# Patient Record
Sex: Female | Born: 1940 | Race: White | Hispanic: No | Smoking: Former smoker
Health system: Southern US, Community
[De-identification: ages and names within clinical notes are randomized; demographics above are authoritative.]

## PROBLEM LIST (undated history)

## (undated) DIAGNOSIS — M1990 Unspecified osteoarthritis, unspecified site: Secondary | ICD-10-CM

---

## 2013-01-12 ENCOUNTER — Other Ambulatory Visit (HOSPITAL_COMMUNITY)
Admission: RE | Admit: 2013-01-12 | Discharge: 2013-01-12 | Disposition: A | Discharge status: Home / Self Care | Payer: Medicare Other | Source: Ambulatory Visit | Attending: Otolaryngology | Admitting: Otolaryngology

## 2013-01-12 DIAGNOSIS — D490 Neoplasm of unspecified behavior of digestive system: Secondary | ICD-10-CM | POA: Insufficient documentation

## 2013-01-19 ENCOUNTER — Other Ambulatory Visit: Payer: Self-pay | Admitting: Otolaryngology

## 2013-01-19 DIAGNOSIS — D490 Neoplasm of unspecified behavior of digestive system: Secondary | ICD-10-CM

## 2013-01-21 ENCOUNTER — Ambulatory Visit
Admission: RE | Admit: 2013-01-21 | Discharge: 2013-01-21 | Disposition: A | Discharge status: Home / Self Care | Payer: Medicare Other | Source: Ambulatory Visit | Attending: Otolaryngology | Admitting: Otolaryngology

## 2013-01-21 DIAGNOSIS — D490 Neoplasm of unspecified behavior of digestive system: Secondary | ICD-10-CM

## 2013-01-21 MED ORDER — IOHEXOL 300 MG/ML  SOLN: 75.0000 mL | Freq: Once | INTRAMUSCULAR | Status: AC | PRN

## 2013-01-25 ENCOUNTER — Other Ambulatory Visit (HOSPITAL_COMMUNITY)
Admission: RE | Admit: 2013-01-25 | Discharge: 2013-01-25 | Disposition: A | Discharge status: Home / Self Care | Payer: Medicare Other | Source: Ambulatory Visit | Attending: Otolaryngology | Admitting: Otolaryngology

## 2013-01-25 ENCOUNTER — Other Ambulatory Visit: Payer: Self-pay | Admitting: Otolaryngology

## 2013-01-25 DIAGNOSIS — R220 Localized swelling, mass and lump, head: Secondary | ICD-10-CM | POA: Insufficient documentation

## 2019-09-18 ENCOUNTER — Ambulatory Visit (HOSPITAL_COMMUNITY)
Admission: EM | Admit: 2019-09-18 | Discharge: 2019-09-18 | Disposition: A | Discharge status: Home / Self Care | Payer: Medicare PPO

## 2019-09-18 ENCOUNTER — Encounter (HOSPITAL_COMMUNITY): Payer: Self-pay

## 2019-09-18 ENCOUNTER — Other Ambulatory Visit: Payer: Self-pay

## 2019-09-18 DIAGNOSIS — M7989 Other specified soft tissue disorders: Secondary | ICD-10-CM

## 2019-09-18 DIAGNOSIS — M79662 Pain in left lower leg: Secondary | ICD-10-CM | POA: Diagnosis not present

## 2019-09-18 MED ORDER — IBUPROFEN 800 MG PO TABS: 800.0000 mg | ORAL_TABLET | Freq: Three times a day (TID) | ORAL | 0 refills | Status: DC
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
## 2019-09-18 NOTE — Discharge Instructions (Addendum)
Take ibuprofen 3 times a day. Compression stockings daily. VERY IMPORTANT to go to ER for worsening pain, swelling, discoloration, or you develop difficulty breathing, chest pain, or palpitations.

## 2020-07-20 ENCOUNTER — Other Ambulatory Visit: Payer: Self-pay

## 2020-07-20 ENCOUNTER — Emergency Department (HOSPITAL_COMMUNITY): Payer: Medicare PPO

## 2020-07-20 ENCOUNTER — Encounter (HOSPITAL_COMMUNITY): Payer: Self-pay

## 2020-07-20 ENCOUNTER — Emergency Department (HOSPITAL_COMMUNITY)
Admission: EM | Admit: 2020-07-20 | Discharge: 2020-07-20 | Disposition: A | Discharge status: Home / Self Care | Payer: Medicare PPO | Attending: Emergency Medicine | Admitting: Emergency Medicine

## 2020-07-20 DIAGNOSIS — Y92019 Unspecified place in single-family (private) house as the place of occurrence of the external cause: Secondary | ICD-10-CM | POA: Insufficient documentation

## 2020-07-20 DIAGNOSIS — M25551 Pain in right hip: Secondary | ICD-10-CM | POA: Diagnosis not present

## 2020-07-20 DIAGNOSIS — R55 Syncope and collapse: Secondary | ICD-10-CM | POA: Insufficient documentation

## 2020-07-20 DIAGNOSIS — S22009A Unspecified fracture of unspecified thoracic vertebra, initial encounter for closed fracture: Secondary | ICD-10-CM | POA: Diagnosis not present

## 2020-07-20 DIAGNOSIS — W228XXA Striking against or struck by other objects, initial encounter: Secondary | ICD-10-CM | POA: Insufficient documentation

## 2020-07-20 DIAGNOSIS — Z87891 Personal history of nicotine dependence: Secondary | ICD-10-CM | POA: Diagnosis not present

## 2020-07-20 DIAGNOSIS — S299XXA Unspecified injury of thorax, initial encounter: Secondary | ICD-10-CM | POA: Diagnosis present

## 2020-07-20 DIAGNOSIS — S22000A Wedge compression fracture of unspecified thoracic vertebra, initial encounter for closed fracture: Secondary | ICD-10-CM

## 2020-07-20 DIAGNOSIS — S2242XA Multiple fractures of ribs, left side, initial encounter for closed fracture: Secondary | ICD-10-CM | POA: Diagnosis not present

## 2020-07-20 LAB — URINALYSIS, ROUTINE W REFLEX MICROSCOPIC
Bacteria, UA: NONE SEEN
Bilirubin Urine: NEGATIVE
Glucose, UA: NEGATIVE mg/dL
Ketones, ur: 80 mg/dL — AB
Leukocytes,Ua: NEGATIVE
Nitrite: NEGATIVE
Protein, ur: NEGATIVE mg/dL
Specific Gravity, Urine: 1.03 (ref 1.005–1.030)
pH: 5 (ref 5.0–8.0)

## 2020-07-20 LAB — CBC
HCT: 41.4 % (ref 36.0–46.0)
Hemoglobin: 13.3 g/dL (ref 12.0–15.0)
MCH: 33.3 pg (ref 26.0–34.0)
MCHC: 32.1 g/dL (ref 30.0–36.0)
MCV: 103.8 fL — ABNORMAL HIGH (ref 80.0–100.0)
Platelets: 195 10*3/uL (ref 150–400)
RBC: 3.99 MIL/uL (ref 3.87–5.11)
RDW: 12.8 % (ref 11.5–15.5)
WBC: 7.2 10*3/uL (ref 4.0–10.5)
nRBC: 0 % (ref 0.0–0.2)

## 2020-07-20 LAB — CBG MONITORING, ED: Glucose-Capillary: 93 mg/dL (ref 70–99)

## 2020-07-20 LAB — BASIC METABOLIC PANEL
Anion gap: 8 (ref 5–15)
BUN: 21 mg/dL (ref 8–23)
CO2: 27 mmol/L (ref 22–32)
Calcium: 9.3 mg/dL (ref 8.9–10.3)
Chloride: 106 mmol/L (ref 98–111)
Creatinine, Ser: 0.77 mg/dL (ref 0.44–1.00)
GFR, Estimated: >60 mL/min (ref >60)
Glucose, Bld: 99 mg/dL (ref 70–99)
Potassium: 3.8 mmol/L (ref 3.5–5.1)
Sodium: 141 mmol/L (ref 135–145)

## 2020-07-20 LAB — TROPONIN I (HIGH SENSITIVITY): Troponin I (High Sensitivity): 4 ng/L (ref <18)

## 2020-07-20 MED ORDER — ACETAMINOPHEN 325 MG PO TABS
650.0000 mg | ORAL_TABLET | Freq: Once | ORAL | Status: DC
  Filled 2020-07-20: qty 2

## 2020-07-20 MED ORDER — IBUPROFEN 200 MG PO TABS
600.0000 mg | ORAL_TABLET | Freq: Once | ORAL | Status: AC
  Administered 2020-07-20: 600 mg via ORAL
  Filled 2020-07-20: qty 3

## 2020-07-20 NOTE — ED Notes (Signed)
Pt given incentive spirometer and instructed on it's use. Pt demonstrated correct use of incentive spirometer.
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
## 2020-08-02 ENCOUNTER — Other Ambulatory Visit: Payer: Self-pay

## 2020-08-02 ENCOUNTER — Encounter: Payer: Self-pay | Admitting: Internal Medicine

## 2020-08-02 ENCOUNTER — Non-Acute Institutional Stay: Payer: Medicare PPO | Admitting: Internal Medicine

## 2020-08-02 VITALS — BP 142/90 | HR 77 | Temp 97.9°F | Ht 68.0 in | Wt 118.9 lb

## 2020-08-02 DIAGNOSIS — S2242XS Multiple fractures of ribs, left side, sequela: Secondary | ICD-10-CM | POA: Diagnosis not present

## 2020-08-02 DIAGNOSIS — S22030D Wedge compression fracture of third thoracic vertebra, subsequent encounter for fracture with routine healing: Secondary | ICD-10-CM

## 2020-08-02 DIAGNOSIS — Z23 Encounter for immunization: Secondary | ICD-10-CM

## 2020-08-02 DIAGNOSIS — M25551 Pain in right hip: Secondary | ICD-10-CM | POA: Diagnosis not present

## 2020-08-02 DIAGNOSIS — R2681 Unsteadiness on feet: Secondary | ICD-10-CM

## 2020-08-02 DIAGNOSIS — I10 Essential (primary) hypertension: Secondary | ICD-10-CM

## 2020-08-02 DIAGNOSIS — M1610 Unilateral primary osteoarthritis, unspecified hip: Secondary | ICD-10-CM | POA: Diagnosis not present

## 2020-08-02 DIAGNOSIS — R4189 Other symptoms and signs involving cognitive functions and awareness: Secondary | ICD-10-CM

## 2020-08-02 NOTE — Progress Notes (Signed)
Location:  [place] of Service:  Clinic (12)  Provider:   Code Status:  Goals of Care:  Advanced Directives 07/20/2020  Does Patient Have a Medical Advance Directive? Yes  Type of Paramedic of [place];Living will     Chief Complaint  Patient presents with  . Medical Management of Chronic Issues    Patient here today to establish care.     HPI: Patient is a 80 y.o. female seen today for medical management of chronic diseases.   Patient with Recent move to [facility] Has recently moved here.   She came to establish care.  She has not seen a primary care physician for many years. Does not take any medications. Her son was also with her  Her active issues today Right hip pain Patient has had hip pain for many years.  It is to the point that it is hard for her to walk and her gait is unstable.  She has had x-ray done in ED which showed severe arthritis She feels she is ready to see orthopedics.  She is requesting to see Dr.Olin Two mechanical falls Last one happened few weeks ago.  She says that she does not remember how exactly she fell.  She did consume alcohol before that and is worried that while she does not remember Went to ED next day when the facility nurse convinced her.  She was found to have left-sided rib fractures.  Old thoracic fractures.  CT scan of head did not show any acute findings Cognitive impairment Noticed by her son and daughter.  She just lost her husband last year to prostate cancer. Has stopped driving now.  Son is taking care of her finances  History reviewed. No pertinent past medical history.  History reviewed. No pertinent surgical history.  No Known Allergies  Outpatient Encounter Medications as of 08/02/2020  Medication Sig  . acetaminophen (TYLENOL) 325 MG tablet Take 650 mg by mouth every 6 (six) hours as needed.  Marland Kitchen ibuprofen (ADVIL) 800 MG tablet Take 1 tablet (800 mg total) by mouth 3 (three)  times daily.  . Multiple Vitamins-Minerals (MULTIVITAMIN WITH MINERALS) tablet Take 1 tablet by mouth daily.  . [DISCONTINUED] cyanocobalamin 100 MCG tablet Take 100 mcg by mouth. Twice a week   No facility-administered encounter medications on file as of 08/02/2020.    Review of Systems:  Review of Systems  Review of Systems  Constitutional: Negative for , appetite change, chills, diaphoresis, positive for fatigue HENT: Negative for mouth sores, postnasal drip, rhinorrhea, sinus pain and sore throat.   Respiratory: Negative for apnea, cough, chest tightness, shortness of breath and wheezing.   Cardiovascular: Negative for chest pain, palpitations and leg swelling.  Gastrointestinal: Negative for abdominal distention, abdominal pain, constipation, diarrhea, nausea and vomiting.  Genitourinary: Negative for dysuria and frequency.  Musculoskeletal: Negative for arthralgias, joint swelling and myalgias.  Skin: Negative for rash.  Neurological: Negative for dizziness, syncope, weakness, light-headedness and numbness.  Psychiatric/Behavioral: Negative for behavioral problems, confusion and sleep disturbance.     Health Maintenance  Topic Date Due  . Zoster Vaccines- Shingrix (1 of 2) Never done  . DEXA SCAN  Never done  . PNA vac Low Risk Adult (1 of 2 - PCV13) Never done  . TETANUS/TDAP  03/04/2017  . INFLUENZA VACCINE  10/02/2020  . COVID-19 Vaccine  Completed  . HPV VACCINES  Aged Out    Physical Exam: Vitals:   08/02/20 1619  BP: (!) 142/90  Pulse: 77  Temp: 97.9 F (36.6 C)  SpO2: 99%  Weight: 118 lb 14.4 oz (53.9 kg)  Height: 5\' 8"  (1.727 m)   Body mass index is 18.08 kg/m. Physical Exam Constitutional: Oriented to person, place, and time. Well-developed and well-nourished.  HENT:  Head: Normocephalic.  Mouth/Throat: Oropharynx is clear and moist.  Eyes: Pupils are equal, round, and reactive to light.  Neck: Neck supple.  Ears TM normal Cardiovascular: Normal  rate and normal heart sounds.  No murmur heard. Pulmonary/Chest: Effort normal and breath sounds normal. No respiratory distress. No wheezes. She has no rales.  Abdominal: Soft. Bowel sounds are normal. No distension. There is no tenderness. There is no rebound.  Musculoskeletal: ChronicVanous changes . Right hip Stiff  Lymphadenopathy: none Neurological: Alert and oriented to person, place, and time.  Has Limping gait Cannot put much weight on Right Hip  Skin: Skin is warm and dry.  Psychiatric: Normal mood and affect. Behavior is normal. Thought content normal.   Labs reviewed: Basic Metabolic Panel: Recent Labs    07/20/20 1008  NA 141  K 3.8  CL 106  CO2 27  GLUCOSE 99  BUN 21  CREATININE 0.77  CALCIUM 9.3   Liver Function Tests: No results for input(s): AST, ALT, ALKPHOS, BILITOT, PROT, ALBUMIN in the last 8760 hours. No results for input(s): LIPASE, AMYLASE in the last 8760 hours. No results for input(s): AMMONIA in the last 8760 hours. CBC: Recent Labs    07/20/20 1008  WBC 7.2  HGB 13.3  HCT 41.4  MCV 103.8*  PLT 195   Lipid Panel: No results for input(s): CHOL, HDL, LDLCALC, TRIG, CHOLHDL, LDLDIRECT in the last 8760 hours. No results found for: HGBA1C  Procedures since last visit: CT Head Wo Contrast  Result Date: 07/20/2020 CLINICAL DATA:  80 year old female status post unwitnessed fall 2 days ago with left chest wall pain and ecchymosis. Right hip pain. EXAM: CT HEAD WITHOUT CONTRAST TECHNIQUE: Contiguous axial images were obtained from the base of the skull through the vertex without intravenous contrast. COMPARISON:  None. FINDINGS: Brain: Cerebral volume is within normal limits for age. No midline shift, ventriculomegaly, mass effect, evidence of mass lesion, intracranial hemorrhage or evidence of cortically based acute infarction. Gray-white matter differentiation is within normal limits throughout the brain. Vascular: No suspicious intracranial vascular  hyperdensity. Skull: Intact, negative. Sinuses/Orbits: Visualized paranasal sinuses and mastoids are clear. Other: No acute orbit or scalp soft tissue finding. IMPRESSION: No acute traumatic injury identified. Normal for age non contrast CT appearance of the brain. Electronically Signed   By: Genevie Ann M.D.   On: 07/20/2020 11:07   CT Chest Wo Contrast  Result Date: 07/20/2020 CLINICAL DATA:  80 year old female status post unwitnessed fall 2 days ago with left chest wall pain and ecchymosis. Right hip pain. EXAM: CT CHEST WITHOUT CONTRAST TECHNIQUE: Multidetector CT imaging of the chest was performed following the standard protocol without IV contrast. COMPARISON:  Neck CT 01/21/2013. FINDINGS: Cardiovascular: Calcified aortic atherosclerosis. Calcified coronary artery atherosclerosis. Upper limits of normal cardiac size. No pericardial effusion. Vascular patency is not evaluated in the absence of IV contrast. Mediastinum/Nodes: No mediastinal hematoma or lymphadenopathy identified in the absence of IV contrast. Lungs/Pleura: Major airways are patent. No pneumothorax. No pulmonary contusion. But there is a trace layering left pleural effusion. Minimal atelectasis or scarring also at the left lung base. No pulmonary nodule. Upper Abdomen: Negative visible noncontrast liver, spleen, pancreas, adrenal glands, kidneys  and bowel in the upper abdomen. Cholelithiasis, with 15 mm gallstones. 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 But mild T2, T3, and T5 superior endplate compression fractures are new since 2014 and age indeterminate (series 7, image 60). Underlying osteopenia. Nondisplaced fracture of the left T9 transverse process on series 5, image 86. Other thoracic vertebrae appear intact. Visible shoulder osseous structures, sternum, and right side ribs appear intact. There are subtle acute fractures of the left 7th,  8th, 9th, and 10th costovertebral junctions (e.g. Series 5, image 69). No other left rib fracture is evident. Visible upper lumbar vertebrae appear intact. IMPRESSION: 1. Osteopenia with acute fractures of left costovertebral junction 7 through 10, associated nondisplaced left T9 transverse process fracture. 2. Mild T2, T3, and T5 superior endplate compression fractures are new since 2014 and age indeterminate but suspicious for acute injury in this setting. 3. Trace left pleural effusion. No pneumothorax or pulmonary contusion. No other acute traumatic injury identified. In the noncontrast chest. 4. Calcified coronary artery and Aortic Atherosclerosis (ICD10-I70.0). Cholelithiasis. Electronically Signed   By: Genevie Ann M.D.   On: 07/20/2020 11:20   DG Hip Unilat W or Wo Pelvis 2-3 Views Right  Result Date: 07/20/2020 CLINICAL DATA:  80 year old female status post unwitnessed fall 2 days ago with left chest wall pain and ecchymosis. Right hip pain. EXAM: DG HIP (WITH OR WITHOUT PELVIS) 2-3V RIGHT COMPARISON:  None. FINDINGS: Severe right hip joint degeneration with complete loss of the superior joint space, subchondral sclerosis, bulky acetabular and femoral head osteophytosis. Other degenerative remodeling of the right femoral head. But the proximal right femur appears to remain intact. No acute pelvis fracture identified. Grossly intact proximal left femur, with normal for age left hip. SI joints appear normal. Negative visible bowel gas. IMPRESSION: Severe unilateral right hip osteoarthritis with no acute fracture or dislocation identified. Electronically Signed   By: Genevie Ann M.D.   On: 07/20/2020 11:10    Assessment/Plan 1. Right hip pain Referal to Ortho  2. Hip arthritis Referal To Ortho Discontinue Aleve unless she really needs it Take only Tylenol PRN  3. Primary hypertension Mildily Elevated BP will follow   4. Closed fracture of multiple ribs of left side, sequela Pain Seems Controlled   Discontinue Aleve and take Tylenol PRn  5. Compression fracture of T2- vertebra with routine healing, subsequent encounter Would Hold Neurosurgery referal right now as she is not having any symptoms or Pain   6. Cognitive impairment MMSE next visit Seems very highly functional  7. Unstable gait   8. Need for vaccination TDAP and Prevnar will start with that first Would Need DEXA Scan next visit    Labs/tests ordered:  * No order type specified * Next appt:  08/03/2020

## 2020-08-03 ENCOUNTER — Ambulatory Visit (INDEPENDENT_AMBULATORY_CARE_PROVIDER_SITE_OTHER): Payer: Medicare PPO | Admitting: *Deleted

## 2020-08-03 ENCOUNTER — Other Ambulatory Visit: Payer: Self-pay

## 2020-08-03 DIAGNOSIS — Z23 Encounter for immunization: Secondary | ICD-10-CM

## 2020-09-28 NOTE — Patient Instructions (Addendum)
DUE TO COVID-19 ONLY ONE VISITOR IS ALLOWED TO COME WITH YOU AND STAY IN THE WAITING ROOM ONLY DURING PRE OP AND PROCEDURE.   **NO VISITORS ARE ALLOWED IN THE SHORT STAY AREA OR RECOVERY ROOM!!**  IF YOU WILL BE ADMITTED INTO THE HOSPITAL YOU ARE ALLOWED ONLY TWO SUPPORT PEOPLE DURING VISITATION HOURS ONLY (10AM -8PM)   The support person(s) may change daily. The support person(s) must pass our screening, gel in and out, and wear a mask at all times, including in the patient's room. Patients must also wear a mask when staff or their support person are in the room.  No visitors under the age of 30. Any visitor under the age of 60 must be accompanied by an adult.    COVID SWAB TESTING MUST BE COMPLETED ON:  09/29/20 @ 11:00   [address] [place], [place] [address] You are not required to quarantine, however you are required to wear a well-fitted mask when you are out and around people not in your household.  Hand Hygiene often Do NOT share personal items Notify your provider if you are in close contact with someone who has COVID or you develop fever 100.4 or greater, new onset of sneezing, cough, sore throat, shortness of breath or body aches.        Your procedure is scheduled on:    Report to [facility] Main  Entrance    Report to admitting at 11:50AM   Call this number if you have problems the morning of surgery [phone]   Do not eat food :After Midnight.   May have liquids until  11:20 AM  day of surgery  CLEAR LIQUID DIET  Foods Allowed                                                                     Foods Excluded  Water, Black Coffee and tea, regular and decaf               liquids that you cannot  Plain Jell-O in any flavor  (No red)                                     see through such as: Fruit ices (not with fruit pulp)                                            milk, soups, orange juice              Iced Popsicles (No red)                                                All solid food                                   Apple juices Sports drinks like Gatorade (No  red) Lightly seasoned clear broth or consume(fat free) Sugar, honey syrup    The day of surgery:  Drink ONE (1) Pre-Surgery Clear Ensure by 11:20 am the morning of surgery. Drink in one sitting. Do not sip.  This drink was given to you during your hospital  pre-op appointment visit. Nothing else to drink after completing the  Pre-Surgery Clear Ensure.          If you have questions, please contact your surgeon's office.     Oral Hygiene is also important to reduce your risk of infection.                                    Remember - BRUSH YOUR TEETH THE MORNING OF SURGERY WITH YOUR REGULAR TOOTHPASTE    Take these medicines the morning of surgery with A SIP OF WATER: Acetaminophen                              You may not have any metal on your body including hair pins, jewelry, and body piercing             Do not wear make-up, lotions, powders, perfumes, or deodorant   Do not bring valuables to the hospital. [place].   Contacts, dentures or bridgework may not be worn into surgery.   Bring small overnight bag day of surgery.   Please read over the following fact sheets you were given: IF YOU HAVE QUESTIONS ABOUT YOUR PRE OP INSTRUCTIONS PLEASE CALL [phone]- [facility] - Preparing for Surgery Before surgery, you can play an important role.  Because skin is not sterile, your skin needs to be as free of germs as possible.  You can reduce the number of germs on your skin by washing with CHG (chlorahexidine gluconate) soap before surgery.  CHG is an antiseptic cleaner which kills germs and bonds with the skin to continue killing germs even after washing. Please DO NOT use if you have an allergy to CHG or antibacterial soaps.  If your skin becomes reddened/irritated stop using the CHG and inform your nurse when  you arrive at Short Stay. Do not shave (including legs and underarms) for at least 48 hours prior to the first CHG shower.  You may shave your face/neck.  Please follow these instructions carefully:  1.  Shower with CHG Soap the night before surgery and the  morning of surgery.  2.  If you choose to wash your hair, wash your hair first as usual with your normal  shampoo.  3.  After you shampoo, rinse your hair and body thoroughly to remove the shampoo.                             4.  Use CHG as you would any other liquid soap.  You can apply chg directly to the skin and wash.  Gently with a scrungie or clean washcloth.  5.  Apply the CHG Soap to your body ONLY FROM THE NECK DOWN.   Do   not use on face/ open  Wound or open sores. Avoid contact with eyes, ears mouth and   genitals (private parts).                       Wash face,  Genitals (private parts) with your normal soap.             6.  Wash thoroughly, paying special attention to the area where your    surgery  will be performed.  7.  Thoroughly rinse your body with warm water from the neck down.  8.  DO NOT shower/wash with your normal soap after using and rinsing off the CHG Soap.                9.  Pat yourself dry with a clean towel.            10.  Wear clean pajamas.            11.  Place clean sheets on your bed the night of your first shower and do not  sleep with pets. Day of Surgery : Do not apply any lotions/deodorants the morning of surgery.  Please wear clean clothes to the hospital/surgery center.  FAILURE TO FOLLOW THESE INSTRUCTIONS MAY RESULT IN THE CANCELLATION OF YOUR SURGERY  PATIENT SIGNATURE_________________________________  NURSE SIGNATURE__________________________________  ________________________________________________________________________   Adam Phenix  An incentive spirometer is a tool that can help keep your lungs clear and active. This tool measures how well you  are filling your lungs with each breath. Taking long deep breaths may help reverse or decrease the chance of developing breathing (pulmonary) problems (especially infection) following: A long period of time when you are unable to move or be active. BEFORE THE PROCEDURE  If the spirometer includes an indicator to show your best effort, your nurse or respiratory therapist will set it to a desired goal. If possible, sit up straight or lean slightly forward. Try not to slouch. Hold the incentive spirometer in an upright position. INSTRUCTIONS FOR USE  Sit on the edge of your bed if possible, or sit up as far as you can in bed or on a chair. Hold the incentive spirometer in an upright position. Breathe out normally. Place the mouthpiece in your mouth and seal your lips tightly around it. Breathe in slowly and as deeply as possible, raising the piston or the ball toward the top of the column. Hold your breath for 3-5 seconds or for as long as possible. Allow the piston or ball to fall to the bottom of the column. Remove the mouthpiece from your mouth and breathe out normally. Rest for a few seconds and repeat Steps 1 through 7 at least 10 times every 1-2 hours when you are awake. Take your time and take a few normal breaths between deep breaths. The spirometer may include an indicator to show your best effort. Use the indicator as a goal to work toward during each repetition. After each set of 10 deep breaths, practice coughing to be sure your lungs are clear. If you have an incision (the cut made at the time of surgery), support your incision when coughing by placing a pillow or rolled up towels firmly against it. Once you are able to get out of bed, walk around indoors and cough well. You may stop using the incentive spirometer when instructed by your caregiver.  RISKS AND COMPLICATIONS Take your time so you do not get dizzy or light-headed. If you are in pain, you may  need to take or ask for pain  medication before doing incentive spirometry. It is harder to take a deep breath if you are having pain. AFTER USE Rest and breathe slowly and easily. It can be helpful to keep track of a log of your progress. Your caregiver can provide you with a simple table to help with this. If you are using the spirometer at home, follow these instructions: [place] IF:  You are having difficultly using the spirometer. You have trouble using the spirometer as often as instructed. Your pain medication is not giving enough relief while using the spirometer. You develop fever of 100.5 F (38.1 C) or higher. SEEK IMMEDIATE MEDICAL CARE IF:  You cough up bloody sputum that had not been present before. You develop fever of 102 F (38.9 C) or greater. You develop worsening pain at or near the incision site. MAKE SURE YOU:  Understand these instructions. Will watch your condition. Will get help right away if you are not doing well or get worse. Document Released: 07/01/2006 Document Revised: 05/13/2011 Document Reviewed: 09/01/2006 Exit[employer] Patient Information 2014 [employer], [place].   ________________________________________________________________________  WHAT IS A BLOOD TRANSFUSION? Blood Transfusion Information  A transfusion is the replacement of blood or some of its parts. Blood is made up of multiple cells which provide different functions. Red blood cells carry oxygen and are used for blood loss replacement. White blood cells fight against infection. Platelets control bleeding. Plasma helps clot blood. Other blood products are available for specialized needs, such as hemophilia or other clotting disorders. BEFORE THE TRANSFUSION  Who gives blood for transfusions?  Healthy volunteers who are fully evaluated to make sure their blood is safe. This is blood bank blood. Transfusion therapy is the safest it has ever been in the practice of medicine. Before blood is taken from a donor, a  complete history is taken to make sure that person has no history of diseases nor engages in risky social behavior (examples are intravenous drug use or sexual activity with multiple partners). The donor's travel history is screened to minimize risk of transmitting infections, such as malaria. The donated blood is tested for signs of infectious diseases, such as HIV and hepatitis. The blood is then tested to be sure it is compatible with you in order to minimize the chance of a transfusion reaction. If you or a relative donates blood, this is often done in anticipation of surgery and is not appropriate for emergency situations. It takes many days to process the donated blood. RISKS AND COMPLICATIONS Although transfusion therapy is very safe and saves many lives, the main dangers of transfusion include:  Getting an infectious disease. Developing a transfusion reaction. This is an allergic reaction to something in the blood you were given. Every precaution is taken to prevent this. The decision to have a blood transfusion has been considered carefully by your caregiver before blood is given. Blood is not given unless the benefits outweigh the risks. AFTER THE TRANSFUSION Right after receiving a blood transfusion, you will usually feel much better and more energetic. This is especially true if your red blood cells have gotten low (anemic). The transfusion raises the level of the red blood cells which carry oxygen, and this usually causes an energy increase. The nurse administering the transfusion will monitor you carefully for complications. HOME CARE INSTRUCTIONS  No special instructions are needed after a transfusion. You may find your energy is better. Speak with your caregiver about any limitations on activity  for underlying diseases you may have. SEEK MEDICAL CARE IF:  Your condition is not improving after your transfusion. You develop redness or irritation at the intravenous (IV) site. SEEK  IMMEDIATE MEDICAL CARE IF:  Any of the following symptoms occur over the next 12 hours: Shaking chills. You have a temperature by mouth above 102 F (38.9 C), not controlled by medicine. Chest, back, or muscle pain. People around you feel you are not acting correctly or are confused. Shortness of breath or difficulty breathing. Dizziness and fainting. You get a rash or develop hives. You have a decrease in urine output. Your urine turns a dark color or changes to pink, red, or brown. Any of the following symptoms occur over the next 10 days: You have a temperature by mouth above 102 F (38.9 C), not controlled by medicine. Shortness of breath. Weakness after normal activity. The white part of the eye turns yellow (jaundice). You have a decrease in the amount of urine or are urinating less often. Your urine turns a dark color or changes to pink, red, or brown. Document Released: 02/16/2000 Document Revised: 05/13/2011 Document Reviewed: 10/05/2007 [facility] Patient Information 2014 [place], [place].  _______________________________________________________________________

## 2020-09-28 NOTE — H&P (Signed)
TOTAL HIP ADMISSION H&P  Patient is admitted for right total hip arthroplasty.  Subjective:  Chief Complaint: right hip pain  HPI: Wendy Blankenship, 80 y.o. female, has a history of pain and functional disability in the right hip(s) due to arthritis and patient has failed non-surgical conservative treatments for greater than 12 weeks to include NSAID's and/or analgesics and activity modification.  Onset of symptoms was gradual starting 2 years ago with gradually worsening course since that time.The patient noted no past surgery on the right hip(s).  Patient currently rates pain in the right hip at 8 out of 10 with activity. Patient has worsening of pain with activity and weight bearing, pain that interfers with activities of daily living, and pain with passive range of motion. Patient has evidence of joint space narrowing by imaging studies. This condition presents safety issues increasing the risk of falls. There is no current active infection.  There are no problems to display for this patient.  No past medical history on file.  No past surgical history on file.  No current facility-administered medications for this encounter.   Current Outpatient Medications  Medication Sig Dispense Refill Last Dose   acetaminophen (TYLENOL) 500 MG tablet Take 1,000 mg by mouth every 8 (eight) hours as needed for mild pain.      Multiple Vitamins-Minerals (MULTIVITAMIN WITH MINERALS) tablet Take 1 tablet by mouth daily.      No Known Allergies  Social History   Tobacco Use   Smoking status: Former   Smokeless tobacco: Never  Substance Use Topics   Alcohol use: Yes    Family History  Problem Relation Age of Onset   Healthy Mother    Parkinson's disease Mother    Healthy Father    Cancer Father      Review of Systems  Constitutional:  Negative for chills and fever.  Respiratory:  Negative for cough and shortness of breath.   Cardiovascular:  Negative for chest pain.  Gastrointestinal:   Negative for nausea and vomiting.  Musculoskeletal:  Positive for arthralgias.   Objective:  Physical Exam Well nourished and well developed. General: Alert and oriented x3, cooperative and pleasant, no acute distress. Head: normocephalic, atraumatic, neck supple. Eyes: EOMI.  Musculoskeletal: Pain with passive and active ROM of the right hip.  Calves soft and nontender. Motor function intact in LE. Strength 5/5 LE bilaterally. Neuro: Distal pulses 2+. Sensation to light touch intact in LE  Vital signs in last 24 hours:   Labs:   Estimated body mass index is 18.08 kg/m as calculated from the following:   Height as of 08/02/20: '5\' 8"'$  (1.727 m).   Weight as of 08/02/20: 53.9 kg.   Imaging Review Plain radiographs demonstrate severe degenerative joint disease of the right hip(s). The bone quality appears to be adequate for age and reported activity level.   Assessment/Plan:  End stage arthritis, right hip(s)  The patient history, physical examination, clinical judgement of the provider and imaging studies are consistent with end stage degenerative joint disease of the right hip(s) and total hip arthroplasty is deemed medically necessary. The treatment options including medical management, injection therapy, arthroscopy and arthroplasty were discussed at length. The risks and benefits of total hip arthroplasty were presented and reviewed. The risks due to aseptic loosening, infection, stiffness, dislocation/subluxation,  thromboembolic complications and other imponderables were discussed.  The patient acknowledged the explanation, agreed to proceed with the plan and consent was signed. Patient is being admitted for inpatient treatment for  surgery, pain control, PT, OT, prophylactic antibiotics, VTE prophylaxis, progressive ambulation and ADL's and discharge planning.The patient is planning to be discharged  home.   Therapy Plans: HEP Disposition: Home with daughter (lives at [facility]) Planned DVT Prophylaxis: aspirin '81mg'$  BID DME needed: walker PCP: Dr. Veleta Miners TXA: IV Allergies: NKDA Anesthesia Concerns: none BMI: 17.9 Last HgbA1c: Not diabetic.  Other: - tylenol, celebrex, robaxin, tramadol   Griffith Citron, PA-C Orthopedic Surgery [facility] [phone]

## 2020-09-28 NOTE — Progress Notes (Addendum)
COVID Vaccine Completed: yes x4 Date COVID Vaccine completed: 04/17/19, 05/15/19 Has received booster: 01/03/20, 08/03/20 COVID vaccine manufacturer:  Moderna   Date of COVID positive in last 90 days: No  PCP - Veleta Miners, MD Cardiologist - N/a  Chest x-ray - CT 07/20/20 Epic EKG - 07/21/20 Epic Stress Test - N/a ECHO - N/a Cardiac Cath - N/a Pacemaker/ICD device last checked:N/a Spinal Cord Stimulator:N/a  Sleep Study - N/a CPAP -   Fasting Blood Sugar - N/a Checks Blood Sugar _____ times a day  Blood Thinner Instructions: N/a Aspirin Instructions: Last Dose:  Activity level: Can go up a flight of stairs and perform activities of daily living without stopping and without symptoms of chest pain or shortness of breath. Only difficulty with stairs is due to hip pain.      Anesthesia review:   Patient denies shortness of breath, fever, cough and chest pain at PAT appointment   Patient verbalized understanding of instructions that were given to them at the PAT appointment. Patient was also instructed that they will need to review over the PAT instructions again at home before surgery.

## 2020-09-29 ENCOUNTER — Encounter (HOSPITAL_COMMUNITY)
Admission: RE | Admit: 2020-09-29 | Discharge: 2020-09-29 | Disposition: A | Discharge status: Home / Self Care | Payer: Medicare PPO | Source: Ambulatory Visit | Attending: Orthopedic Surgery | Admitting: Orthopedic Surgery

## 2020-09-29 ENCOUNTER — Other Ambulatory Visit: Payer: Self-pay

## 2020-09-29 ENCOUNTER — Encounter (HOSPITAL_COMMUNITY): Payer: Self-pay

## 2020-09-29 ENCOUNTER — Other Ambulatory Visit (HOSPITAL_COMMUNITY)
Admission: RE | Admit: 2020-09-29 | Discharge: 2020-09-29 | Disposition: A | Discharge status: Home / Self Care | Payer: Medicare PPO | Source: Ambulatory Visit | Attending: Orthopedic Surgery | Admitting: Orthopedic Surgery

## 2020-09-29 DIAGNOSIS — Z01812 Encounter for preprocedural laboratory examination: Secondary | ICD-10-CM | POA: Insufficient documentation

## 2020-09-29 DIAGNOSIS — Z20822 Contact with and (suspected) exposure to covid-19: Secondary | ICD-10-CM | POA: Insufficient documentation

## 2020-09-29 HISTORY — DX: Unspecified osteoarthritis, unspecified site: M19.90

## 2020-09-29 LAB — SURGICAL PCR SCREEN
MRSA, PCR: NEGATIVE
Staphylococcus aureus: NEGATIVE

## 2020-09-29 LAB — COMPREHENSIVE METABOLIC PANEL
ALT: 17 U/L (ref 0–44)
AST: 21 U/L (ref 15–41)
Albumin: 4.2 g/dL (ref 3.5–5.0)
Alkaline Phosphatase: 49 U/L (ref 38–126)
Anion gap: 7 (ref 5–15)
BUN: 17 mg/dL (ref 8–23)
CO2: 28 mmol/L (ref 22–32)
Calcium: 9.5 mg/dL (ref 8.9–10.3)
Chloride: 105 mmol/L (ref 98–111)
Creatinine, Ser: 0.66 mg/dL (ref 0.44–1.00)
GFR, Estimated: >60 mL/min (ref >60)
Glucose, Bld: 90 mg/dL (ref 70–99)
Potassium: 4.6 mmol/L (ref 3.5–5.1)
Sodium: 140 mmol/L (ref 135–145)
Total Bilirubin: 0.8 mg/dL (ref 0.3–1.2)
Total Protein: 7.1 g/dL (ref 6.5–8.1)

## 2020-09-29 LAB — CBC
HCT: 40.5 % (ref 36.0–46.0)
Hemoglobin: 13.2 g/dL (ref 12.0–15.0)
MCH: 33.1 pg (ref 26.0–34.0)
MCHC: 32.6 g/dL (ref 30.0–36.0)
MCV: 101.5 fL — ABNORMAL HIGH (ref 80.0–100.0)
Platelets: 228 10*3/uL (ref 150–400)
RBC: 3.99 MIL/uL (ref 3.87–5.11)
RDW: 12.1 % (ref 11.5–15.5)
WBC: 4.8 10*3/uL (ref 4.0–10.5)
nRBC: 0 % (ref 0.0–0.2)

## 2020-09-29 LAB — SARS CORONAVIRUS 2 (TAT 6-24 HRS): SARS Coronavirus 2: NEGATIVE

## 2020-10-03 ENCOUNTER — Other Ambulatory Visit: Payer: Self-pay

## 2020-10-03 ENCOUNTER — Observation Stay (HOSPITAL_COMMUNITY): Payer: Medicare PPO

## 2020-10-03 ENCOUNTER — Ambulatory Visit (HOSPITAL_COMMUNITY): Payer: Medicare PPO

## 2020-10-03 ENCOUNTER — Encounter (HOSPITAL_COMMUNITY): Payer: Self-pay | Admitting: Orthopedic Surgery

## 2020-10-03 ENCOUNTER — Ambulatory Visit (HOSPITAL_COMMUNITY): Payer: Medicare PPO | Admitting: Registered Nurse

## 2020-10-03 ENCOUNTER — Encounter (HOSPITAL_COMMUNITY)
Admission: RE | Disposition: A | Discharge status: Home / Self Care | Payer: Self-pay | Source: Home / Self Care | Attending: Orthopedic Surgery

## 2020-10-03 ENCOUNTER — Observation Stay (HOSPITAL_COMMUNITY)
Admission: RE | Admit: 2020-10-03 | Discharge: 2020-10-05 | Disposition: A | Discharge status: Home / Self Care | Payer: Medicare PPO | Attending: Orthopedic Surgery | Admitting: Orthopedic Surgery

## 2020-10-03 DIAGNOSIS — Z20822 Contact with and (suspected) exposure to covid-19: Secondary | ICD-10-CM | POA: Insufficient documentation

## 2020-10-03 DIAGNOSIS — M1611 Unilateral primary osteoarthritis, right hip: Principal | ICD-10-CM | POA: Insufficient documentation

## 2020-10-03 DIAGNOSIS — Z96649 Presence of unspecified artificial hip joint: Secondary | ICD-10-CM

## 2020-10-03 DIAGNOSIS — Z87891 Personal history of nicotine dependence: Secondary | ICD-10-CM | POA: Diagnosis not present

## 2020-10-03 DIAGNOSIS — S72041A Displaced fracture of base of neck of right femur, initial encounter for closed fracture: Secondary | ICD-10-CM

## 2020-10-03 HISTORY — PX: TOTAL HIP ARTHROPLASTY: SHX124

## 2020-10-03 LAB — TYPE AND SCREEN
ABO/RH(D): A POS
Antibody Screen: NEGATIVE

## 2020-10-03 SURGERY — ARTHROPLASTY, HIP, TOTAL, ANTERIOR APPROACH
Anesthesia: Spinal | Site: Hip | Laterality: Right

## 2020-10-03 MED ORDER — ONDANSETRON HCL 4 MG/2ML IJ SOLN: 4.0000 mg | Freq: Four times a day (QID) | INTRAMUSCULAR | Status: DC | PRN

## 2020-10-03 MED ORDER — BISACODYL 10 MG RE SUPP: 10.0000 mg | Freq: Every day | RECTAL | Status: DC | PRN

## 2020-10-03 MED ORDER — STERILE WATER FOR IRRIGATION IR SOLN
Status: DC | PRN
  Administered 2020-10-03: 2000 mL

## 2020-10-03 MED ORDER — TRAMADOL HCL 50 MG PO TABS
50.0000 mg | ORAL_TABLET | Freq: Four times a day (QID) | ORAL | Status: DC | PRN
  Filled 2020-10-03: qty 2

## 2020-10-03 MED ORDER — TRANEXAMIC ACID-NACL 1000-0.7 MG/100ML-% IV SOLN: 1000.0000 mg | Freq: Once | INTRAVENOUS | Status: DC

## 2020-10-03 MED ORDER — ACETAMINOPHEN 10 MG/ML IV SOLN
INTRAVENOUS | Status: AC
  Filled 2020-10-03: qty 100

## 2020-10-03 MED ORDER — DEXAMETHASONE SODIUM PHOSPHATE 10 MG/ML IJ SOLN
10.0000 mg | Freq: Once | INTRAMUSCULAR | Status: AC
  Administered 2020-10-04: 10 mg via INTRAVENOUS
  Filled 2020-10-03: qty 1

## 2020-10-03 MED ORDER — TRANEXAMIC ACID-NACL 1000-0.7 MG/100ML-% IV SOLN
1000.0000 mg | Freq: Once | INTRAVENOUS | Status: AC
  Administered 2020-10-03: 1000 mg via INTRAVENOUS
  Filled 2020-10-03: qty 100

## 2020-10-03 MED ORDER — METOCLOPRAMIDE HCL 5 MG PO TABS
5.0000 mg | ORAL_TABLET | Freq: Three times a day (TID) | ORAL | Status: DC | PRN
Start: 2020-10-03 — End: 2020-10-05

## 2020-10-03 MED ORDER — FENTANYL CITRATE (PF) 100 MCG/2ML IJ SOLN
INTRAMUSCULAR | Status: AC
  Filled 2020-10-03: qty 2

## 2020-10-03 MED ORDER — CEFAZOLIN SODIUM-DEXTROSE 2-4 GM/100ML-% IV SOLN
2.0000 g | INTRAVENOUS | Status: AC
Start: 2020-10-03 — End: 2020-10-03
  Administered 2020-10-03: 2 g via INTRAVENOUS
  Filled 2020-10-03: qty 100

## 2020-10-03 MED ORDER — METHOCARBAMOL 1000 MG/10ML IJ SOLN
500.0000 mg | Freq: Four times a day (QID) | INTRAVENOUS | Status: DC | PRN
  Filled 2020-10-03: qty 5

## 2020-10-03 MED ORDER — 0.9 % SODIUM CHLORIDE (POUR BTL) OPTIME
TOPICAL | Status: DC | PRN
  Administered 2020-10-03: 1000 mL

## 2020-10-03 MED ORDER — PROPOFOL 500 MG/50ML IV EMUL
INTRAVENOUS | Status: DC | PRN
  Administered 2020-10-03: 50 ug/kg/min via INTRAVENOUS

## 2020-10-03 MED ORDER — SODIUM CHLORIDE 0.9 % IV SOLN: INTRAVENOUS | Status: DC

## 2020-10-03 MED ORDER — ALBUMIN HUMAN 5 % IV SOLN: INTRAVENOUS | Status: DC | PRN

## 2020-10-03 MED ORDER — ONDANSETRON HCL 4 MG/2ML IJ SOLN
INTRAMUSCULAR | Status: DC | PRN
  Administered 2020-10-03: 4 mg via INTRAVENOUS

## 2020-10-03 MED ORDER — MENTHOL 3 MG MT LOZG: 1.0000 | LOZENGE | OROMUCOSAL | Status: DC | PRN

## 2020-10-03 MED ORDER — LACTATED RINGERS IV SOLN: INTRAVENOUS | Status: DC

## 2020-10-03 MED ORDER — ACETAMINOPHEN 10 MG/ML IV SOLN
INTRAVENOUS | Status: DC | PRN
  Administered 2020-10-03: 1000 mg via INTRAVENOUS

## 2020-10-03 MED ORDER — MORPHINE SULFATE (PF) 2 MG/ML IV SOLN: 0.5000 mg | INTRAVENOUS | Status: DC | PRN

## 2020-10-03 MED ORDER — ACETAMINOPHEN 325 MG PO TABS
325.0000 mg | ORAL_TABLET | Freq: Four times a day (QID) | ORAL | Status: DC | PRN
  Administered 2020-10-05: 650 mg via ORAL
  Filled 2020-10-03: qty 2

## 2020-10-03 MED ORDER — POLYETHYLENE GLYCOL 3350 17 G PO PACK: 17.0000 g | PACK | Freq: Every day | ORAL | Status: DC | PRN

## 2020-10-03 MED ORDER — EPHEDRINE SULFATE-NACL 50-0.9 MG/10ML-% IV SOSY
PREFILLED_SYRINGE | INTRAVENOUS | Status: DC | PRN
  Administered 2020-10-03: 5 mg via INTRAVENOUS

## 2020-10-03 MED ORDER — FENTANYL CITRATE (PF) 100 MCG/2ML IJ SOLN
INTRAMUSCULAR | Status: AC
  Administered 2020-10-03: 25 ug via INTRAVENOUS
  Filled 2020-10-03: qty 2

## 2020-10-03 MED ORDER — FERROUS SULFATE 325 (65 FE) MG PO TABS
325.0000 mg | ORAL_TABLET | Freq: Three times a day (TID) | ORAL | Status: DC
  Administered 2020-10-04 – 2020-10-05 (×4): 325 mg via ORAL
  Filled 2020-10-03 (×4): qty 1

## 2020-10-03 MED ORDER — METHOCARBAMOL 500 MG PO TABS
500.0000 mg | ORAL_TABLET | Freq: Four times a day (QID) | ORAL | Status: DC | PRN
  Filled 2020-10-03: qty 1

## 2020-10-03 MED ORDER — ONDANSETRON HCL 4 MG PO TABS: 4.0000 mg | ORAL_TABLET | Freq: Four times a day (QID) | ORAL | Status: DC | PRN

## 2020-10-03 MED ORDER — PROPOFOL 500 MG/50ML IV EMUL
INTRAVENOUS | Status: AC
  Filled 2020-10-03: qty 50

## 2020-10-03 MED ORDER — PROPOFOL 10 MG/ML IV BOLUS
INTRAVENOUS | Status: DC | PRN
  Administered 2020-10-03 (×4): 20 mg via INTRAVENOUS

## 2020-10-03 MED ORDER — POVIDONE-IODINE 10 % EX SWAB
2.0000 | Freq: Once | CUTANEOUS | Status: AC
Start: 2020-10-03 — End: 2020-10-03
  Administered 2020-10-03: 2 via TOPICAL

## 2020-10-03 MED ORDER — DIPHENHYDRAMINE HCL 12.5 MG/5ML PO ELIX
12.5000 mg | ORAL_SOLUTION | ORAL | Status: DC | PRN
Start: 2020-10-03 — End: 2020-10-05

## 2020-10-03 MED ORDER — ONDANSETRON HCL 4 MG/2ML IJ SOLN
INTRAMUSCULAR | Status: AC
  Filled 2020-10-03: qty 2

## 2020-10-03 MED ORDER — ORAL CARE MOUTH RINSE: 15.0000 mL | Freq: Once | OROMUCOSAL | Status: AC

## 2020-10-03 MED ORDER — PHENYLEPHRINE HCL (PRESSORS) 10 MG/ML IV SOLN
INTRAVENOUS | Status: AC
  Filled 2020-10-03: qty 2

## 2020-10-03 MED ORDER — ASPIRIN 81 MG PO CHEW
81.0000 mg | CHEWABLE_TABLET | Freq: Two times a day (BID) | ORAL | Status: DC
  Administered 2020-10-03 – 2020-10-05 (×4): 81 mg via ORAL
  Filled 2020-10-03 (×4): qty 1

## 2020-10-03 MED ORDER — DOCUSATE SODIUM 100 MG PO CAPS
100.0000 mg | ORAL_CAPSULE | Freq: Two times a day (BID) | ORAL | Status: DC
  Administered 2020-10-03 – 2020-10-05 (×4): 100 mg via ORAL
  Filled 2020-10-03 (×4): qty 1

## 2020-10-03 MED ORDER — ACETAMINOPHEN 500 MG PO TABS: 1000.0000 mg | ORAL_TABLET | Freq: Once | ORAL | Status: DC

## 2020-10-03 MED ORDER — DEXAMETHASONE SODIUM PHOSPHATE 10 MG/ML IJ SOLN
INTRAMUSCULAR | Status: AC
  Filled 2020-10-03: qty 1

## 2020-10-03 MED ORDER — ALBUMIN HUMAN 5 % IV SOLN
INTRAVENOUS | Status: AC
  Filled 2020-10-03: qty 250

## 2020-10-03 MED ORDER — CELECOXIB 200 MG PO CAPS
200.0000 mg | ORAL_CAPSULE | Freq: Two times a day (BID) | ORAL | Status: DC
  Administered 2020-10-03 – 2020-10-05 (×4): 200 mg via ORAL
  Filled 2020-10-03 (×4): qty 1

## 2020-10-03 MED ORDER — BUPIVACAINE IN DEXTROSE 0.75-8.25 % IT SOLN
INTRATHECAL | Status: DC | PRN
  Administered 2020-10-03: 1.6 mL via INTRATHECAL

## 2020-10-03 MED ORDER — FENTANYL CITRATE (PF) 100 MCG/2ML IJ SOLN
INTRAMUSCULAR | Status: DC | PRN
  Administered 2020-10-03: 50 ug via INTRAVENOUS

## 2020-10-03 MED ORDER — CEFAZOLIN SODIUM-DEXTROSE 2-4 GM/100ML-% IV SOLN
2.0000 g | Freq: Four times a day (QID) | INTRAVENOUS | Status: AC
  Administered 2020-10-03 – 2020-10-04 (×2): 2 g via INTRAVENOUS
  Filled 2020-10-03 (×2): qty 100

## 2020-10-03 MED ORDER — METOCLOPRAMIDE HCL 5 MG/ML IJ SOLN: 5.0000 mg | Freq: Three times a day (TID) | INTRAMUSCULAR | Status: DC | PRN

## 2020-10-03 MED ORDER — TRANEXAMIC ACID-NACL 1000-0.7 MG/100ML-% IV SOLN
1000.0000 mg | INTRAVENOUS | Status: AC
  Administered 2020-10-03: 1000 mg via INTRAVENOUS
  Filled 2020-10-03: qty 100

## 2020-10-03 MED ORDER — PHENYLEPHRINE HCL-NACL 20-0.9 MG/250ML-% IV SOLN
INTRAVENOUS | Status: DC | PRN
  Administered 2020-10-03: 25 ug/min via INTRAVENOUS

## 2020-10-03 MED ORDER — CHLORHEXIDINE GLUCONATE 0.12 % MT SOLN
15.0000 mL | Freq: Once | OROMUCOSAL | Status: AC
  Administered 2020-10-03: 15 mL via OROMUCOSAL

## 2020-10-03 MED ORDER — EPHEDRINE 5 MG/ML INJ
INTRAVENOUS | Status: AC
  Filled 2020-10-03: qty 5

## 2020-10-03 MED ORDER — PHENOL 1.4 % MT LIQD: 1.0000 | OROMUCOSAL | Status: DC | PRN

## 2020-10-03 MED ORDER — HYDROCODONE-ACETAMINOPHEN 5-325 MG PO TABS
1.0000 | ORAL_TABLET | ORAL | Status: DC | PRN
  Administered 2020-10-04 (×3): 1 via ORAL
  Filled 2020-10-03 (×3): qty 1

## 2020-10-03 MED ORDER — PROPOFOL 10 MG/ML IV BOLUS
INTRAVENOUS | Status: AC
  Filled 2020-10-03: qty 20

## 2020-10-03 MED ORDER — DEXAMETHASONE SODIUM PHOSPHATE 10 MG/ML IJ SOLN
8.0000 mg | Freq: Once | INTRAMUSCULAR | Status: AC
  Administered 2020-10-03: 8 mg via INTRAVENOUS

## 2020-10-03 MED ORDER — FENTANYL CITRATE (PF) 100 MCG/2ML IJ SOLN
25.0000 ug | INTRAMUSCULAR | Status: DC | PRN
  Administered 2020-10-03 (×3): 25 ug via INTRAVENOUS

## 2020-10-03 SURGICAL SUPPLY — 44 items
ADH SKN CLS APL DERMABOND .7 (GAUZE/BANDAGES/DRESSINGS) ×1
BAG COUNTER SPONGE SURGICOUNT (BAG) IMPLANT
BAG DECANTER FOR FLEXI CONT (MISCELLANEOUS) IMPLANT
BAG SPEC THK2 15X12 ZIP CLS (MISCELLANEOUS)
BAG SPNG CNTER NS LX DISP (BAG)
BAG ZIPLOCK 12X15 (MISCELLANEOUS) IMPLANT
BLADE SAG 18X100X1.27 (BLADE) ×2 IMPLANT
COVER PERINEAL POST (MISCELLANEOUS) ×2 IMPLANT
COVER SURGICAL LIGHT HANDLE (MISCELLANEOUS) ×2 IMPLANT
CUP ACETBLR 54 OD PINNACLE (Hips) ×1 IMPLANT
DERMABOND ADVANCED (GAUZE/BANDAGES/DRESSINGS) ×1
DERMABOND ADVANCED .7 DNX12 (GAUZE/BANDAGES/DRESSINGS) ×1 IMPLANT
DRAPE FOOT SWITCH (DRAPES) ×2 IMPLANT
DRAPE STERI IOBAN 125X83 (DRAPES) ×2 IMPLANT
DRAPE U-SHAPE 47X51 STRL (DRAPES) ×4 IMPLANT
DRESSING AQUACEL AG SP 3.5X10 (GAUZE/BANDAGES/DRESSINGS) ×1 IMPLANT
DRSG AQUACEL AG ADV 3.5X10 (GAUZE/BANDAGES/DRESSINGS) ×1 IMPLANT
DRSG AQUACEL AG SP 3.5X10 (GAUZE/BANDAGES/DRESSINGS) ×2
DURAPREP 26ML APPLICATOR (WOUND CARE) ×2 IMPLANT
ELECT REM PT RETURN 15FT ADLT (MISCELLANEOUS) ×2 IMPLANT
ELIMINATOR HOLE APEX DEPUY (Hips) ×1 IMPLANT
GLOVE SURG ENC MOIS LTX SZ6 (GLOVE) ×4 IMPLANT
GLOVE SURG UNDER LTX SZ7.5 (GLOVE) ×2 IMPLANT
GLOVE SURG UNDER POLY LF SZ6.5 (GLOVE) ×2 IMPLANT
GLOVE SURG UNDER POLY LF SZ7.5 (GLOVE) ×4 IMPLANT
GOWN STRL REUS W/TWL LRG LVL3 (GOWN DISPOSABLE) ×4 IMPLANT
HEAD M SROM 36MM PLUS 1.5 (Hips) IMPLANT
HOLDER FOLEY CATH W/STRAP (MISCELLANEOUS) ×2 IMPLANT
KIT TURNOVER KIT A (KITS) ×2 IMPLANT
LINER NEUTRAL 54X36MM PLUS 4 (Hips) ×1 IMPLANT
PACK ANTERIOR HIP CUSTOM (KITS) ×2 IMPLANT
PENCIL SMOKE EVACUATOR (MISCELLANEOUS) IMPLANT
SCREW 6.5MMX30MM (Screw) ×1 IMPLANT
SROM M HEAD 36MM PLUS 1.5 (Hips) ×2 IMPLANT
STEM FEMORAL SZ6 HIGH ACTIS (Stem) ×1 IMPLANT
SUT MNCRL AB 4-0 PS2 18 (SUTURE) ×2 IMPLANT
SUT STRATAFIX 0 PDS 27 VIOLET (SUTURE) ×2
SUT VIC AB 1 CT1 36 (SUTURE) ×6 IMPLANT
SUT VIC AB 2-0 CT1 27 (SUTURE) ×4
SUT VIC AB 2-0 CT1 TAPERPNT 27 (SUTURE) ×2 IMPLANT
SUTURE STRATFX 0 PDS 27 VIOLET (SUTURE) ×1 IMPLANT
TRAY FOLEY MTR SLVR 16FR STAT (SET/KITS/TRAYS/PACK) IMPLANT
TUBE SUCTION HIGH CAP CLEAR NV (SUCTIONS) ×2 IMPLANT
WATER STERILE IRR 1000ML POUR (IV SOLUTION) ×2 IMPLANT

## 2020-10-03 NOTE — Transfer of Care (Signed)
Immediate Anesthesia Transfer of Care Note  Patient: Wendy Blankenship  Procedure(s) Performed: TOTAL HIP ARTHROPLASTY ANTERIOR APPROACH (Right: Hip)  Patient Location: PACU  Anesthesia Type:Spinal  Level of Consciousness: awake, alert  and oriented  Airway & Oxygen Therapy: Patient Spontanous Breathing and Patient connected to face mask oxygen  Post-op Assessment: Report given to RN and Post -op Vital signs reviewed and stable  Post vital signs: Reviewed and stable  Last Vitals:  Vitals Value Taken Time  BP    Temp    Pulse    Resp    SpO2      Last Pain:  Vitals:   10/03/20 1231  TempSrc:   PainSc: 0-No pain      Patients Stated Pain Goal: 4 ([id number])  Complications: No notable events documented.

## 2020-10-03 NOTE — Anesthesia Procedure Notes (Signed)
Spinal  Patient location during procedure: OR Start time: 10/03/2020 3:02 PM End time: 10/03/2020 3:08 PM Reason for block: surgical anesthesia Staffing Performed: resident/CRNA  Resident/CRNA: Lollie Sails, CRNA Preanesthetic Checklist Completed: patient identified, IV checked, site marked, risks and benefits discussed, surgical consent, monitors and equipment checked, pre-op evaluation and timeout performed Spinal Block Patient position: sitting Prep: DuraPrep and site prepped and draped Patient monitoring: heart rate, continuous pulse ox, blood pressure and cardiac monitor Approach: midline Location: L3-4 Injection technique: single-shot Needle Needle type: Pencan  Needle gauge: 24 G Needle length: 10 cm Assessment Events: CSF return Additional Notes Expiration date on kit noted and within range.  Sterile prep and drape.   Localized with 1% Lidocaine.  Good CSF flow noted with no heme or c/o paresthesia.   Patient tolerated well.

## 2020-10-03 NOTE — Interval H&P Note (Signed)
History and Physical Interval Note:  10/03/2020 1:35 PM  Wendy Blankenship  has presented today for surgery, with the diagnosis of Right hip osteoarthritis.  The various methods of treatment have been discussed with the patient and family. After consideration of risks, benefits and other options for treatment, the patient has consented to  Procedure(s): TOTAL HIP ARTHROPLASTY ANTERIOR APPROACH (Right) as a surgical intervention.  The patient's history has been reviewed, patient examined, no change in status, stable for surgery.  I have reviewed the patient's chart and labs.  Questions were answered to the patient's satisfaction.     Mauri Pole

## 2020-10-03 NOTE — Discharge Instructions (Signed)

## 2020-10-03 NOTE — Anesthesia Postprocedure Evaluation (Signed)
Anesthesia Post Note  Patient: Wendy Blankenship  Procedure(s) Performed: TOTAL HIP ARTHROPLASTY ANTERIOR APPROACH (Right: Hip)     Patient location during evaluation: PACU Anesthesia Type: Spinal Level of consciousness: oriented and awake and alert Pain management: pain level controlled Vital Signs Assessment: post-procedure vital signs reviewed and stable Respiratory status: spontaneous breathing, respiratory function stable and patient connected to nasal cannula oxygen Cardiovascular status: blood pressure returned to baseline and stable Postop Assessment: no headache, no backache, no apparent nausea or vomiting, spinal receding and patient able to bend at knees Anesthetic complications: no   No notable events documented.  Last Vitals:  Vitals:   10/03/20 1723 10/03/20 1730  BP:  (!) 115/97  Pulse: 60 62  Resp: 20 19  Temp: 36.7 C   SpO2: 100% 100%    Last Pain:  Vitals:   10/03/20 1750  TempSrc:   PainSc: 0-No pain                 Khloee Garza,W. EDMOND

## 2020-10-03 NOTE — Anesthesia Preprocedure Evaluation (Addendum)
Anesthesia Evaluation  Patient identified by MRN, date of birth, ID band Patient awake    Reviewed: Allergy & Precautions, H&P , NPO status , Patient's Chart, lab work & pertinent test results  Airway Mallampati: III  TM Distance: >3 FB Neck ROM: Full    Dental no notable dental hx. (+) Teeth Intact, Dental Advisory Given   Pulmonary neg pulmonary ROS, former smoker,    Pulmonary exam normal breath sounds clear to auscultation       Cardiovascular negative cardio ROS   Rhythm:Regular Rate:Normal     Neuro/Psych negative neurological ROS  negative psych ROS   GI/Hepatic negative GI ROS, Neg liver ROS,   Endo/Other  negative endocrine ROS  Renal/GU negative Renal ROS  negative genitourinary   Musculoskeletal  (+) Arthritis , Osteoarthritis,    Abdominal   Peds  Hematology negative hematology ROS (+)   Anesthesia Other Findings   Reproductive/Obstetrics negative OB ROS                            Anesthesia Physical Anesthesia Plan  ASA: 2  Anesthesia Plan: Spinal   Post-op Pain Management:    Induction: Intravenous  PONV Risk Score and Plan: 3 and Propofol infusion, Ondansetron and Dexamethasone  Airway Management Planned: Simple Face Mask  Additional Equipment:   Intra-op Plan:   Post-operative Plan:   Informed Consent: I have reviewed the patients History and Physical, chart, labs and discussed the procedure including the risks, benefits and alternatives for the proposed anesthesia with the patient or authorized representative who has indicated his/her understanding and acceptance.     Dental advisory given  Plan Discussed with: CRNA  Anesthesia Plan Comments:         Anesthesia Quick Evaluation

## 2020-10-03 NOTE — Op Note (Signed)
NAME:  Wendy Blankenship                ACCOUNT NO.: [id number]      MEDICAL RECORD NO.: [id number]      FACILITY:  [facility]      PHYSICIAN:  Mauri Pole  DATE OF BIRTH:  February 14, 1941     DATE OF PROCEDURE:  10/03/2020                                 OPERATIVE REPORT         PREOPERATIVE DIAGNOSIS: Right  hip osteoarthritis.      POSTOPERATIVE DIAGNOSIS:  Right hip osteoarthritis.      PROCEDURE:  Right total hip replacement through an anterior approach   utilizing DePuy THR system, component size 54 mm pinnacle cup, a size 36+4 neutral   Altrex liner, a size 6 Hi Actis stem with a 36+1.5 Articuleze metal head ball.      SURGEON:  Pietro Cassis. Alvan Dame, M.D.      ASSISTANT:  Costella Hatcher, PA-C     ANESTHESIA:  Spinal.      SPECIMENS:  None.      COMPLICATIONS:  None.      BLOOD LOSS:  525 cc     DRAINS:  None.      INDICATION OF THE PROCEDURE:  Wendy Blankenship is a 80 y.o. female who had   presented to office for evaluation of right hip pain.  Radiographs revealed   progressive degenerative changes with bone-on-bone   articulation of the  hip joint, including subchondral cystic changes and osteophytes.  The patient had painful limited range of   motion significantly affecting their overall quality of life and function.  The patient was failing to    respond to conservative measures including medications and/or injections and activity modification and at this point was ready   to proceed with more definitive measures.  Consent was obtained for   benefit of pain relief.  Specific risks of infection, DVT, component   failure, dislocation, neurovascular injury, and need for revision surgery were reviewed in the office as well discussion of   the anterior versus posterior approach were reviewed.     PROCEDURE IN DETAIL:  The patient was brought to operative theater.   Once adequate anesthesia, preoperative antibiotics, 2 gm of Ancef, 1 gm of  Tranexamic Acid, and 10 mg of Decadron were administered, the patient was positioned supine on the [employer] table.  Once the patient was safely positioned with adequate padding of boney prominences we predraped out the hip, and used fluoroscopy to confirm orientation of the pelvis.      The right hip was then prepped and draped from proximal iliac crest to   mid thigh with a shower curtain technique.      Time-out was performed identifying the patient, planned procedure, and the appropriate extremity.     An incision was then made 2 cm lateral to the   anterior superior iliac spine extending over the orientation of the   tensor fascia lata muscle and sharp dissection was carried down to the   fascia of the muscle.      The fascia was then incised.  The muscle belly was identified and swept   laterally and retractor placed along the superior neck.  Following   cauterization of the circumflex vessels and removing some pericapsular  fat, a second cobra retractor was placed on the inferior neck.  A T-capsulotomy was made along the line of the   superior neck to the trochanteric fossa, then extended proximally and   distally.  Tag sutures were placed and the retractors were then placed   intracapsular.  We then identified the trochanteric fossa and   orientation of my neck cut and then made a neck osteotomy with the femur on traction.  The femoral   head was removed without difficulty or complication.  Traction was let   off and retractors were placed posterior and anterior around the   acetabulum.      The labrum and foveal tissue were debrided.  I began reaming with a 44 mm   reamer and reamed up to 53 mm reamer with good bony bed preparation and a 54 mm  cup was chosen.  The final 54 mm Pinnacle cup was then impacted under fluoroscopy to confirm the depth of penetration and orientation with respect to   Abduction and forward flexion.  A screw was placed into the ilium followed by the hole  eliminator.  The final   36+4 neutral Altrex liner was impacted with good visualized rim fit.  The cup was positioned anatomically within the acetabular portion of the pelvis.      At this point, the femur was rolled to 100 degrees.  Further capsule was   released off the inferior aspect of the femoral neck.  I then   released the superior capsule proximally.  With the leg in a neutral position the hook was placed laterally   along the femur under the vastus lateralis origin and elevated manually and then held in position using the hook attachment on the bed.  The leg was then extended and adducted with the leg rolled to 100   degrees of external rotation.  Retractors were placed along the medial calcar and posteriorly over the greater trochanter.  Once the proximal femur was fully   exposed, I used a box osteotome to set orientation.  I then began   broaching with the starting chili pepper broach and passed this by hand and then broached up to 6.  With the 6 broach in place I chose a high offset neck and did several trial reductions.  The offset was appropriate, leg lengths   appeared to be equal best matched with the +1.5 head ball trial confirmed radiographically.   Given these findings, I went ahead and dislocated the hip, repositioned all   retractors and positioned the right hip in the extended and abducted position.  The final 6 Hi Actis stem was   chosen and it was impacted down to the level of neck cut.  Based on this   and the trial reductions, a final 36+1.5 Articuleze metal head ball was chosen and   impacted onto a clean and dry trunnion, and the hip was reduced.  The   hip had been irrigated throughout the case again at this point.  I did   reapproximate the superior capsular leaflet to the anterior leaflet   using #1 Vicryl.  The fascia of the   tensor fascia lata muscle was then reapproximated using #1 Vicryl and #0 Stratafix sutures.  The   remaining wound was closed with 2-0  Vicryl and running 4-0 Monocryl.   The hip was cleaned, dried, and dressed sterilely using Dermabond and   Aquacel dressing.  The patient was then brought   to recovery room  in stable condition tolerating the procedure well.    Costella Hatcher, PA-C was present for the entirety of the case involved from   preoperative positioning, perioperative retractor management, general   facilitation of the case, as well as primary wound closure as assistant.            Pietro Cassis Alvan Dame, M.D.        10/03/2020 3:10 PM

## 2020-10-03 NOTE — Anesthesia Procedure Notes (Signed)
Procedure Name: MAC Date/Time: 10/03/2020 3:00 PM Performed by: Lollie Sails, CRNA Pre-anesthesia Checklist: Patient identified, Emergency Drugs available, Suction available, Patient being monitored and Timeout performed Oxygen Delivery Method: Simple face mask Placement Confirmation: positive ETCO2

## 2020-10-04 ENCOUNTER — Encounter (HOSPITAL_COMMUNITY): Payer: Self-pay | Admitting: Orthopedic Surgery

## 2020-10-04 DIAGNOSIS — M1611 Unilateral primary osteoarthritis, right hip: Secondary | ICD-10-CM | POA: Diagnosis not present

## 2020-10-04 LAB — BASIC METABOLIC PANEL
Anion gap: 8 (ref 5–15)
BUN: 12 mg/dL (ref 8–23)
CO2: 24 mmol/L (ref 22–32)
Calcium: 8.4 mg/dL — ABNORMAL LOW (ref 8.9–10.3)
Chloride: 106 mmol/L (ref 98–111)
Creatinine, Ser: 0.76 mg/dL (ref 0.44–1.00)
GFR, Estimated: >60 mL/min (ref >60)
Glucose, Bld: 166 mg/dL — ABNORMAL HIGH (ref 70–99)
Potassium: 4.2 mmol/L (ref 3.5–5.1)
Sodium: 138 mmol/L (ref 135–145)

## 2020-10-04 LAB — SARS CORONAVIRUS 2 (TAT 6-24 HRS): SARS Coronavirus 2: NEGATIVE

## 2020-10-04 LAB — CBC
HCT: 29.9 % — ABNORMAL LOW (ref 36.0–46.0)
Hemoglobin: 9.7 g/dL — ABNORMAL LOW (ref 12.0–15.0)
MCH: 32.8 pg (ref 26.0–34.0)
MCHC: 32.4 g/dL (ref 30.0–36.0)
MCV: 101 fL — ABNORMAL HIGH (ref 80.0–100.0)
Platelets: 160 10*3/uL (ref 150–400)
RBC: 2.96 MIL/uL — ABNORMAL LOW (ref 3.87–5.11)
RDW: 12.2 % (ref 11.5–15.5)
WBC: 10.7 10*3/uL — ABNORMAL HIGH (ref 4.0–10.5)
nRBC: 0 % (ref 0.0–0.2)

## 2020-10-04 MED ORDER — ASPIRIN 81 MG PO CHEW: 81.0000 mg | CHEWABLE_TABLET | Freq: Two times a day (BID) | ORAL | 0 refills | Status: DC

## 2020-10-04 MED ORDER — POLYETHYLENE GLYCOL 3350 17 G PO PACK: 17.0000 g | PACK | Freq: Every day | ORAL | 0 refills | Status: DC | PRN

## 2020-10-04 MED ORDER — DOCUSATE SODIUM 100 MG PO CAPS: 100.0000 mg | ORAL_CAPSULE | Freq: Two times a day (BID) | ORAL | 0 refills | Status: DC

## 2020-10-04 MED ORDER — TRAMADOL HCL 50 MG PO TABS: 50.0000 mg | ORAL_TABLET | Freq: Four times a day (QID) | ORAL | 0 refills | Status: DC | PRN

## 2020-10-04 MED ORDER — METHOCARBAMOL 500 MG PO TABS: 500.0000 mg | ORAL_TABLET | Freq: Four times a day (QID) | ORAL | 0 refills | Status: DC | PRN

## 2020-10-04 MED ORDER — CELECOXIB 200 MG PO CAPS: 200.0000 mg | ORAL_CAPSULE | Freq: Two times a day (BID) | ORAL | 0 refills | Status: DC

## 2020-10-04 NOTE — NC FL2 (Signed)
[place] LEVEL OF CARE SCREENING TOOL     IDENTIFICATION  Patient Name: Wendy Blankenship Birthdate: 10/29/1940 Sex: female Admission Date (Current Location): 10/03/2020  [facility] and [place] Number:  Herbalist and Address:  [facility],  [address], [place]      Provider Number: [id number]  Attending Physician Name and Address:  Paralee Cancel, MD  Relative Name and Phone Number:  Alphonsa Overall (daughter) Ph: [phone]    Current Level of Care: Hospital Recommended Level of Care: [place] Prior Approval Number:    Date Approved/Denied:   PASRR Number: [id number]  Discharge Plan: SNF    Current Diagnoses: Patient Active Problem List   Diagnosis Date Noted   S/P right total hip arthroplasty 10/03/2020    Orientation RESPIRATION BLADDER Height & Weight     Self, Time, Situation, Place  Normal Continent Weight: 111 lb 9.6 oz (50.6 kg) Height:  '5\' 7"'$  (170.2 cm)  BEHAVIORAL SYMPTOMS/MOOD NEUROLOGICAL BOWEL NUTRITION STATUS      Continent Diet (Regular diet)  AMBULATORY STATUS COMMUNICATION OF NEEDS Skin   Limited Assist Verbally Surgical wounds                       Personal Care Assistance Level of Assistance  Bathing, Feeding, Dressing Bathing Assistance: Limited assistance Feeding assistance: Independent Dressing Assistance: Limited assistance     Functional Limitations Info  Sight, Hearing, Speech Sight Info: Impaired Hearing Info: Adequate Speech Info: Adequate    SPECIAL CARE FACTORS FREQUENCY  PT (By licensed PT), OT (By licensed OT)     PT Frequency: 5x's/week OT Frequency: 5x's/week            Contractures Contractures Info: Not present    Additional Factors Info  Code Status, Allergies Code Status Info: Full Allergies Info: NKA           Current Medications (10/04/2020):  This is the current hospital active medication list Current Facility-Administered  Medications  Medication Dose Route Frequency Provider Last Rate Last Admin   0.9 %  sodium chloride infusion   Intravenous Continuous Irving Copas, PA-C 75 mL/hr at 10/03/20 1932 New Bag at 10/03/20 1932   acetaminophen (TYLENOL) tablet 325-650 mg  325-650 mg Oral Q6H PRN Irving Copas, PA-C       aspirin chewable tablet 81 mg  81 mg Oral BID Irving Copas, PA-C   81 mg at 10/04/20 0805   bisacodyl (DULCOLAX) suppository 10 mg  10 mg Rectal Daily PRN Irving Copas, PA-C       celecoxib (CELEBREX) capsule 200 mg  200 mg Oral BID Irving Copas, PA-C   200 mg at 10/04/20 0805   diphenhydrAMINE (BENADRYL) 12.5 MG/5ML elixir 12.5-25 mg  12.5-25 mg Oral Q4H PRN Irving Copas, PA-C       docusate sodium (COLACE) capsule 100 mg  100 mg Oral BID Irving Copas, PA-C   100 mg at 10/04/20 [id number]   ferrous sulfate tablet 325 mg  325 mg Oral TID PC Irving Copas, PA-C   325 mg at 10/04/20 1235   HYDROcodone-acetaminophen (NORCO/VICODIN) 5-325 MG per tablet 1 tablet  1 tablet Oral Q4H PRN Irving Copas, PA-C   1 tablet at 10/04/20 0805   menthol-cetylpyridinium (CEPACOL) lozenge 3 mg  1 lozenge Oral PRN Irving Copas, PA-C       Or   phenol (CHLORASEPTIC) mouth spray 1 spray  1 spray Mouth/Throat PRN Irving Copas, PA-C       methocarbamol (ROBAXIN) tablet 500 mg  500 mg Oral Q6H PRN Irving Copas, PA-C       Or   methocarbamol (ROBAXIN) 500 mg in dextrose 5 % 50 mL IVPB  500 mg Intravenous Q6H PRN Irving Copas, PA-C       metoCLOPramide (REGLAN) tablet 5-10 mg  5-10 mg Oral Q8H PRN Irving Copas, PA-C       Or   metoCLOPramide (REGLAN) injection 5-10 mg  5-10 mg Intravenous Q8H PRN Irving Copas, PA-C       morphine 2 MG/ML injection 0.5-1 mg  0.5-1 mg Intravenous Q2H PRN Irving Copas, PA-C       ondansetron [facility]) tablet 4 mg  4 mg Oral Q6H PRN Irving Copas, PA-C       Or   ondansetron [facility]) injection 4 mg  4 mg Intravenous Q6H PRN  Costella Hatcher R, PA-C       polyethylene glycol (MIRALAX / GLYCOLAX) packet 17 g  17 g Oral Daily PRN Irving Copas, PA-C       traMADol Veatrice Bourbon) tablet 50-100 mg  50-100 mg Oral Q6H PRN Irving Copas, PA-C         Discharge Medications: Please see discharge summary for a list of discharge medications.  Relevant Imaging Results:  Relevant Lab Results:   Additional Information SSN: [id number]  Sherie Don, LCSW

## 2020-10-04 NOTE — Progress Notes (Signed)
Physical Therapy Treatment Patient Details Name: Wendy Blankenship MRN: [id number] DOB: 1940-03-09 Today's Date: 10/04/2020    History of Present Illness 80 yo female s/p R THA-DA 10/03/20    PT Comments    Progressing well. Pain controlled. Plan is for d/c to SNF on tomorrow.    Follow Up Recommendations  SNF     Equipment Recommendations  None recommended by PT    Recommendations for Other Services       Precautions / Restrictions Precautions Precautions: Fall Restrictions Weight Bearing Restrictions: No Other Position/Activity Restrictions: WBAT    Mobility  Bed Mobility Overal bed mobility: Needs Assistance Bed Mobility: Supine to Sit     Supine to sit: Min guard;HOB elevated     General bed mobility comments: Increased time.    Transfers Overall transfer level: Needs assistance Equipment used: Rolling walker (2 wheeled) Transfers: Sit to/from Stand Sit to Stand: Min assist         General transfer comment: Cues for safety, technique, sequence, hand/LE placement. Assist to steady.  Ambulation/Gait Ambulation/Gait assistance: Min assist Gait Distance (Feet): 200 Feet Assistive device: Rolling walker (2 wheeled) Gait Pattern/deviations: Step-through pattern;Decreased step length - left;Decreased stride length     General Gait Details: Cues for safety, RW proximity, posture, step length, and heel-toe on L. Assist to steady. Pt tolerated distance well.   Stairs             Wheelchair Mobility    Modified Rankin (Stroke Patients Only)       Balance Overall balance assessment: Needs assistance         Standing balance support: Bilateral upper extremity supported Standing balance-Leahy Scale: Poor                              Cognition Arousal/Alertness: Awake/alert Behavior During Therapy: WFL for tasks assessed/performed Overall Cognitive Status: Within Functional Limits for tasks assessed                                  General Comments: seems to have mild memory impairment at times. can get distracted as well      Exercises Total Joint Exercises Ankle Circles/Pumps: AROM;Both;10 reps Quad Sets: AROM;Both;10 reps Heel Slides: AAROM;Right;10 reps Hip ABduction/ADduction: AAROM;Right;10 reps    General Comments        Pertinent Vitals/Pain Pain Assessment: 0-10 Pain Score: 3  Pain Location: R hip/thigh Pain Descriptors / Indicators: Discomfort;Sore;Tender;Tightness Pain Intervention(s): Monitored during session;Repositioned    Home Living Family/patient expects to be discharged to:: Skilled nursing facility Living Arrangements: Alone   Type of Home: Independent living facility     Home Layout: One level Home Equipment: Environmental consultant - 2 wheels;Cane - single point      Prior Function Level of Independence: Independent          PT Goals (current goals can now be found in the care plan section) Acute Rehab PT Goals Patient Stated Goal: regain PLOF and return to Ind Living apt PT Goal Formulation: With patient/family Time For Goal Achievement: 10/18/20 Potential to Achieve Goals: Good Progress towards PT goals: Progressing toward goals    Frequency    7X/week      PT Plan Current plan remains appropriate    Co-evaluation              AM-PAC PT "6 Clicks" Mobility   Outcome  Measure  Help needed turning from your back to your side while in a flat bed without using bedrails?: A Little Help needed moving from lying on your back to sitting on the side of a flat bed without using bedrails?: A Little Help needed moving to and from a bed to a chair (including a wheelchair)?: A Little Help needed standing up from a chair using your arms (e.g., wheelchair or bedside chair)?: A Little Help needed to walk in hospital room?: A Little Help needed climbing 3-5 steps with a railing? : A Little 6 Click Score: 18    End of Session Equipment Utilized During Treatment:  Gait belt Activity Tolerance: Patient tolerated treatment well Patient left: in chair;with call bell/phone within reach;with chair alarm set;with family/visitor present   PT Visit Diagnosis: Other abnormalities of gait and mobility (R26.89) Pain - Right/Left: Right Pain - part of body: Hip     Time: [id number] PT Time Calculation (min) (ACUTE ONLY): 17 min  Charges:  $Gait Training: 8-22 mins                         Doreatha Massed, PT Acute Rehabilitation  Office: [phone] Pager: [phone]

## 2020-10-04 NOTE — Plan of Care (Signed)
  Problem: Education: Goal: Knowledge of the prescribed therapeutic regimen will improve Outcome: Progressing   Problem: Activity: Goal: Ability to avoid complications of mobility impairment will improve Outcome: Progressing   Problem: Clinical Measurements: Goal: Postoperative complications will be avoided or minimized Outcome: Progressing   Problem: Pain Management: Goal: Pain level will decrease with appropriate interventions Outcome: Progressing   Problem: Coping: Goal: Level of anxiety will decrease Outcome: Progressing

## 2020-10-04 NOTE — Progress Notes (Signed)
   Subjective: 1 Day Post-Op Procedure(s) (LRB): TOTAL HIP ARTHROPLASTY ANTERIOR APPROACH (Right) Patient reports pain as mild.   Patient seen in rounds by Dr. Alvan Dame. Patient is well, and has had no acute complaints or problems other than mild discomfort in the hip. No acute events overnight.  We will start therapy today.   Objective: Vital signs in last 24 hours: Temp:  [97.4 F (36.3 C)-99.1 F (37.3 C)] 98.4 F (36.9 C) (08/03 0529) Pulse Rate:  [60-74] 65 (08/03 0529) Resp:  [12-20] 16 (08/03 0529) BP: (92-130)/(46-97) 93/59 (08/03 0529) SpO2:  [96 %-100 %] 100 % (08/03 0529) Weight:  [50.6 kg] 50.6 kg (08/02 1231)  Intake/Output from previous day:  Intake/Output Summary (Last 24 hours) at 10/04/2020 0722 Last data filed at 10/04/2020 0600 Gross per 24 hour  Intake 2885.76 ml  Output 2425 ml  Net 460.76 ml     Intake/Output this shift: No intake/output data recorded.  Labs: Recent Labs    10/04/20 0314  HGB 9.7*   Recent Labs    10/04/20 0314  WBC 10.7*  RBC 2.96*  HCT 29.9*  PLT 160   Recent Labs    10/04/20 0314  NA 138  K 4.2  CL 106  CO2 24  BUN 12  CREATININE 0.76  GLUCOSE 166*  CALCIUM 8.4*   No results for input(s): LABPT, INR in the last 72 hours.  Exam: General - Patient is Alert and Oriented Extremity - Neurologically intact Sensation intact distally Intact pulses distally Dorsiflexion/Plantar flexion intact Dressing - dressing C/D/I Motor Function - intact, moving foot and toes well on exam.   Past Medical History:  Diagnosis Date   Arthritis     Assessment/Plan: 1 Day Post-Op Procedure(s) (LRB): TOTAL HIP ARTHROPLASTY ANTERIOR APPROACH (Right) Active Problems:   S/P right total hip arthroplasty  Estimated body mass index is 17.48 kg/m as calculated from the following:   Height as of this encounter: '5\' 7"'$  (1.702 m).   Weight as of this encounter: 50.6 kg. Advance diet Up with therapy D/C IV fluids  DVT Prophylaxis -  Aspirin Weight bearing as tolerated.  Plan is to go Home after hospital stay. Plan to get up with PT today. Patient resides at [facility] and will plan to return there. Will utilize social work to determine if she can go today or requires coordination.   Griffith Citron, PA-C Orthopedic Surgery [phone] 10/04/2020, 7:22 AM

## 2020-10-04 NOTE — Evaluation (Signed)
Physical Therapy Evaluation Patient Details Name: Wendy Blankenship MRN: [id number] DOB: 21-Jan-1941 Today's Date: 10/04/2020   History of Present Illness  80 yo female s/p R THA-DA 10/03/20  Clinical Impression  On eval, pt required Min A for safe mobility. She walked ~150 feet with a RW. Minimal pain with activity. Gait is mildly antalgic. Pt presents with general weakness, decreased activity tolerance and impaired gait and balance. Pt lives alone at baseline. Discussed d/c plan-pt is agreeable to ST rehab at SNF at [facility]. Pt will benefit from ST SNF to maximize independence and safety with functional mobility and to regain PLOF prior to returning to Ind Living apt. Will follow and progress activity as tolerated.     Follow Up Recommendations SNF    Equipment Recommendations  None recommended by PT    Recommendations for Other Services       Precautions / Restrictions Precautions Precautions: Fall Restrictions Weight Bearing Restrictions: No Other Position/Activity Restrictions: WBAT      Mobility  Bed Mobility Overal bed mobility: Needs Assistance Bed Mobility: Supine to Sit     Supine to sit: Min assist;HOB elevated     General bed mobility comments: Small amount of assist for R LE. Increased time. Cues required.    Transfers Overall transfer level: Needs assistance Equipment used: Rolling walker (2 wheeled) Transfers: Sit to/from Stand Sit to Stand: Min assist;From elevated surface         General transfer comment: Cues for safety, technique, sequence, hand/LE placement  Ambulation/Gait Ambulation/Gait assistance: Min assist Gait Distance (Feet): 150 Feet Assistive device: Rolling walker (2 wheeled) Gait Pattern/deviations: Step-through pattern;Decreased stride length;Decreased step length - left     General Gait Details: Cues for safety, technique, sequence. As distance progressed, pt began to use a step through pattern but step length and stride  are decreased on L. Tolerated distance well. No lightheadedness.  Stairs            Wheelchair Mobility    Modified Rankin (Stroke Patients Only)       Balance Overall balance assessment: Needs assistance         Standing balance support: Bilateral upper extremity supported Standing balance-Leahy Scale: Poor                               Pertinent Vitals/Pain Pain Assessment: 0-10 Pain Score: 3  Pain Location: R hip/thigh Pain Descriptors / Indicators: Discomfort;Sore;Tender;Tightness Pain Intervention(s): Monitored during session;Repositioned;Ice applied    Home Living Family/patient expects to be discharged to:: Skilled nursing facility Living Arrangements: Alone   Type of Home: Independent living facility       Home Layout: One level Home Equipment: Walker - 2 wheels;Cane - single point      Prior Function Level of Independence: Independent               Hand Dominance        Extremity/Trunk Assessment   Upper Extremity Assessment Upper Extremity Assessment: Overall WFL for tasks assessed    Lower Extremity Assessment Lower Extremity Assessment: Generalized weakness    Cervical / Trunk Assessment Cervical / Trunk Assessment: Normal  Communication   Communication: No difficulties  Cognition Arousal/Alertness: Awake/alert Behavior During Therapy: WFL for tasks assessed/performed Overall Cognitive Status: Within Functional Limits for tasks assessed  General Comments      Exercises Total Joint Exercises Ankle Circles/Pumps: AROM;Both;10 reps Quad Sets: AROM;Both;10 reps Heel Slides: AAROM;Right;10 reps Hip ABduction/ADduction: AAROM;Right;10 reps   Assessment/Plan    PT Assessment Patient needs continued PT services  PT Problem List Decreased strength;Decreased range of motion;Decreased mobility;Decreased activity tolerance;Decreased balance;Decreased knowledge  of use of DME;Pain       PT Treatment Interventions DME instruction;Gait training;Therapeutic exercise;Balance training;Functional mobility training;Therapeutic activities;Patient/family education    PT Goals (Current goals can be found in the Care Plan section)  Acute Rehab PT Goals Patient Stated Goal: regain PLOF and return to Ind Living apt PT Goal Formulation: With patient/family Time For Goal Achievement: 10/18/20 Potential to Achieve Goals: Good    Frequency 7X/week   Barriers to discharge        Co-evaluation               AM-PAC PT "6 Clicks" Mobility  Outcome Measure Help needed turning from your back to your side while in a flat bed without using bedrails?: A Little Help needed moving from lying on your back to sitting on the side of a flat bed without using bedrails?: A Little Help needed moving to and from a bed to a chair (including a wheelchair)?: A Little Help needed standing up from a chair using your arms (e.g., wheelchair or bedside chair)?: A Little Help needed to walk in hospital room?: A Little Help needed climbing 3-5 steps with a railing? : A Little 6 Click Score: 18    End of Session Equipment Utilized During Treatment: Gait belt Activity Tolerance: Patient tolerated treatment well Patient left: in chair;with call bell/phone within reach;with family/visitor present;with chair alarm set   PT Visit Diagnosis: Other abnormalities of gait and mobility (R26.89);Pain Pain - Right/Left: Right Pain - part of body: Hip    Time: [id number] PT Time Calculation (min) (ACUTE ONLY): 27 min   Charges:   PT Evaluation $PT Eval Low Complexity: 1 Low PT Treatments $Gait Training: 8-22 mins           Doreatha Massed, PT Acute Rehabilitation  Office: [phone] Pager: [phone]

## 2020-10-04 NOTE — TOC Initial Note (Signed)
Transition of Care [facility]) - Initial/Assessment Note   Patient Details  Name: Wendy Blankenship MRN: [id number] Date of Birth: 07/18/1940  Transition of Care [facility]) CM/SW Contact:    Sherie Don, LCSW Phone Number: 10/04/2020, 11:32 AM  Clinical Narrative: CSW met with patient and daughter. Per patient, she resides at [facility] in independent living but will need SNF while she recovers from surgery. Ortho PA is agreeable to SNF. COVID test has been collected.  CSW spoke with Jordan in admissions at [facility] and the family had prearranged SNF before the surgery due to living alone and limitations with family support at this time.  FL2 done; PASRR received. Insurance authorization done. [employer] reference ID is: [id number]. Start date is 10/05/20 and next review date is 10/07/20. CSW left VM updating [place]. CSW updated daughter. TOC to follow.  Expected Discharge Plan: Skilled Nursing Facility Barriers to Discharge: Insurance Authorization, Continued Medical Work up  Patient Goals and CMS Choice Patient states their goals for this hospitalization and ongoing recovery are:: Discharge to [facility] for rehab and then transition back to independent living [employer].gov Compare Post Acute Care list provided to:: Patient Choice offered to / list presented to : Patient, Adult Children  Expected Discharge Plan and Services Expected Discharge Plan: [place] In-house Referral: Clinical Social Work Post Acute Care Choice: [place] Living arrangements for the past 2 months: [place] Expected Discharge Date: 10/04/20               DME Arranged: N/A DME Agency: NA  Prior Living Arrangements/Services Living arrangements for the past 2 months: [place] Lives with:: Self Patient language and need for interpreter reviewed:: Yes Do you feel safe going back to the place where you live?: Yes      Need for  Family Participation in Patient Care: Yes (Comment) Care giver support system in place?: Yes (comment) Current home services: DME (Rolling walker) Criminal Activity/Legal Involvement Pertinent to Current Situation/Hospitalization: No - Comment as needed  Activities of Daily Living Home Assistive Devices/Equipment: Eyeglasses, Cane (specify quad or straight), Grab bars in shower, Hand-held shower hose, Built-in shower seat ADL Screening (condition at time of admission) Patient's cognitive ability adequate to safely complete daily activities?: Yes Is the patient deaf or have difficulty hearing?: No Does the patient have difficulty seeing, even when wearing glasses/contacts?: No Does the patient have difficulty concentrating, remembering, or making decisions?: No Patient able to express need for assistance with ADLs?: Yes Does the patient have difficulty dressing or bathing?: No Independently performs ADLs?: Yes (appropriate for developmental age) Does the patient have difficulty walking or climbing stairs?: Yes Weakness of Legs: None Weakness of Arms/Hands: None  Permission Sought/Granted Permission sought to share information with : Facility Art therapist granted to share information with : Yes, Verbal Permission Granted Permission granted to share info w AGENCY: [facility]  Emotional Assessment Appearance:: Appears stated age Attitude/Demeanor/Rapport: Engaged Affect (typically observed): Accepting Orientation: : Oriented to Self, Oriented to Place, Oriented to  Time, Oriented to Situation Alcohol / Substance Use: Not Applicable Psych Involvement: No (comment)  Admission diagnosis:  S/P total hip arthroplasty [Z96.649] Patient Active Problem List   Diagnosis Date Noted   S/P right total hip arthroplasty 10/03/2020   PCP:  Virgie Dad, MD Pharmacy:   [facility] [place] [address] AT [facility]  [place] [address] Phone: [phone] Fax: [phone]  Readmission Risk Interventions No flowsheet data found.

## 2020-10-04 NOTE — Plan of Care (Signed)
  Problem: Pain Management: Goal: Pain level will decrease with appropriate interventions Outcome: Progressing   Problem: Activity: Goal: Risk for activity intolerance will decrease Outcome: Progressing   

## 2020-10-05 ENCOUNTER — Encounter: Payer: Self-pay | Admitting: Internal Medicine

## 2020-10-05 ENCOUNTER — Non-Acute Institutional Stay (SKILLED_NURSING_FACILITY): Payer: Medicare PPO | Admitting: Internal Medicine

## 2020-10-05 DIAGNOSIS — K5909 Other constipation: Secondary | ICD-10-CM

## 2020-10-05 DIAGNOSIS — Z7189 Other specified counseling: Secondary | ICD-10-CM

## 2020-10-05 DIAGNOSIS — Z96641 Presence of right artificial hip joint: Secondary | ICD-10-CM

## 2020-10-05 DIAGNOSIS — R4189 Other symptoms and signs involving cognitive functions and awareness: Secondary | ICD-10-CM | POA: Diagnosis not present

## 2020-10-05 DIAGNOSIS — M1611 Unilateral primary osteoarthritis, right hip: Secondary | ICD-10-CM | POA: Diagnosis not present

## 2020-10-05 DIAGNOSIS — D649 Anemia, unspecified: Secondary | ICD-10-CM

## 2020-10-05 LAB — CBC
HCT: 32.4 % — ABNORMAL LOW (ref 36.0–46.0)
Hemoglobin: 10.5 g/dL — ABNORMAL LOW (ref 12.0–15.0)
MCH: 32.8 pg (ref 26.0–34.0)
MCHC: 32.4 g/dL (ref 30.0–36.0)
MCV: 101.3 fL — ABNORMAL HIGH (ref 80.0–100.0)
Platelets: 169 10*3/uL (ref 150–400)
RBC: 3.2 MIL/uL — ABNORMAL LOW (ref 3.87–5.11)
RDW: 12.4 % (ref 11.5–15.5)
WBC: 13 10*3/uL — ABNORMAL HIGH (ref 4.0–10.5)
nRBC: 0 % (ref 0.0–0.2)

## 2020-10-05 MED ORDER — TRAMADOL HCL 50 MG PO TABS: 50.0000 mg | ORAL_TABLET | Freq: Four times a day (QID) | ORAL | 0 refills | Status: DC | PRN

## 2020-10-05 MED ORDER — POLYETHYLENE GLYCOL 3350 17 G PO PACK: 17.0000 g | PACK | Freq: Every day | ORAL | 0 refills | Status: AC | PRN

## 2020-10-05 MED ORDER — METHOCARBAMOL 500 MG PO TABS: 500.0000 mg | ORAL_TABLET | Freq: Four times a day (QID) | ORAL | 0 refills | Status: DC | PRN

## 2020-10-05 MED ORDER — ASPIRIN 81 MG PO CHEW: 81.0000 mg | CHEWABLE_TABLET | Freq: Two times a day (BID) | ORAL | 0 refills | Status: AC

## 2020-10-05 MED ORDER — DOCUSATE SODIUM 100 MG PO CAPS: 100.0000 mg | ORAL_CAPSULE | Freq: Two times a day (BID) | ORAL | 0 refills | Status: DC

## 2020-10-05 MED ORDER — CELECOXIB 200 MG PO CAPS: 200.0000 mg | ORAL_CAPSULE | Freq: Two times a day (BID) | ORAL | 0 refills | Status: DC

## 2020-10-05 NOTE — TOC Transition Note (Signed)
Transition of Care [facility]) - CM/SW Discharge Note  Patient Details  Name: ROWYN NORQUIST MRN: [id number] Date of Birth: 02/03/1941  Transition of Care [facility]) CM/SW Contact:  Sherie Don, LCSW Phone Number: 10/05/2020, 9:50 AM  Clinical Narrative: Patient ready for discharge. COVID test is negative. Discharge summary, discharge orders, FL2, and SNF transfer report faxed to facility in hub. Per Otilio Carpen, patient will go to room 32 and the number for report is [phone] ext [phone] or ext [phone]. Medical necessity form done; PTAR scheduled. Discharge packet completed. RN updated. Daughter notified of discharge. TOC signing off.  Final next level of care: Skilled Nursing Facility Barriers to Discharge: Barriers Resolved  Patient Goals and CMS Choice Patient states their goals for this hospitalization and ongoing recovery are:: Discharge to [facility] for rehab and then transition back to independent living CMS Medicare.gov Compare Post Acute Care list provided to:: Patient Choice offered to / list presented to : Patient, Adult Children  Discharge Placement PASRR number recieved: 10/04/20        Patient chooses bed at: [facility] Patient to be transferred to facility by: [place] Name of family member notified: Alphonsa Overall (daughter) Patient and family notified of of transfer: 10/05/20  Discharge Plan and Services In-house Referral: Clinical Social Work Post Acute Care Choice: [place]          DME Arranged: N/A DME Agency: NA  Readmission Risk Interventions No flowsheet data found.

## 2020-10-05 NOTE — Discharge Summary (Addendum)
Physician Discharge Summary   Patient ID: Wendy Blankenship MRN: [id number] DOB/AGE: [birth year] 82 y.o.  Admit date: 10/03/2020 Discharge date: 10/05/2020  Primary Diagnosis: Right  hip osteoarthritis.  Admission Diagnoses:  Past Medical History:  Diagnosis Date   Arthritis    Discharge Diagnoses:   Active Problems:   S/P right total hip arthroplasty  Estimated body mass index is 17.48 kg/m as calculated from the following:   Height as of this encounter: '5\' 7"'$  (1.702 m).   Weight as of this encounter: 50.6 kg.  Procedure:  Procedure(s) (LRB): TOTAL HIP ARTHROPLASTY ANTERIOR APPROACH (Right)   Consults: None  HPI:  Wendy Blankenship is a 80 y.o. female who had  presented to office for evaluation of right hip pain.  Radiographs revealed  progressive degenerative changes with bone-on-bone  articulation of the  hip joint, including subchondral cystic changes and osteophytes.  The patient had painful limited range of  motion significantly affecting their overall quality of life and function.  The patient was failing to    respond to conservative measures including medications and/or injections and activity modification and at this point was ready  to proceed with more definitive measures.  Consent was obtained for  benefit of pain relief.  Specific risks of infection, DVT, component  failure, dislocation, neurovascular injury, and need for revision surgery were reviewed in the office as well discussion of  the anterior versus posterior approach were reviewed.  Laboratory Data: Admission on 10/03/2020  Component Date Value Ref Range Status   WBC 10/04/2020 10.7 (A) 4.0 - 10.5 K/uL Final   RBC 10/04/2020 2.96 (A) 3.87 - 5.11 MIL/uL Final   Hemoglobin 10/04/2020 9.7 (A) 12.0 - 15.0 g/dL Final   HCT 10/04/2020 29.9 (A) 36.0 - 46.0 % Final   MCV 10/04/2020 101.0 (A) 80.0 - 100.0 fL Final   MCH 10/04/2020 32.8  26.0 - 34.0 pg Final   MCHC 10/04/2020 32.4  30.0 - 36.0 g/dL  Final   RDW 10/04/2020 12.2  11.5 - 15.5 % Final   Platelets 10/04/2020 160  150 - 400 K/uL Final   nRBC 10/04/2020 0.0  0.0 - 0.2 % Final   Performed at [facility], [place] [address], [place], [place] [address]   Sodium 10/04/2020 138  135 - 145 mmol/L Final   Potassium 10/04/2020 4.2  3.5 - 5.1 mmol/L Final   Chloride 10/04/2020 106  98 - 111 mmol/L Final   CO2 10/04/2020 24  22 - 32 mmol/L Final   Glucose, Bld 10/04/2020 166 (A) 70 - 99 mg/dL Final   Glucose reference range applies only to samples taken after fasting for at least 8 hours.   BUN 10/04/2020 12  8 - 23 mg/dL Final   Creatinine, Ser 10/04/2020 0.76  0.44 - 1.00 mg/dL Final   Calcium 10/04/2020 8.4 (A) 8.9 - 10.3 mg/dL Final   GFR, Estimated 10/04/2020 >60  >60 mL/min Final   Comment: (NOTE) Calculated using the CKD-EPI Creatinine Equation (2021)    Anion gap 10/04/2020 8  5 - 15 Final   Performed at [facility], [place] [address], [place], [place] [address]   SARS Coronavirus 2 10/04/2020 NEGATIVE  NEGATIVE Final   Comment: (NOTE) SARS-CoV-2 target nucleic acids are NOT DETECTED.  The SARS-CoV-2 RNA is generally detectable in upper and lower respiratory specimens during the acute phase of infection. Negative results do not preclude SARS-CoV-2 infection, do not rule out co-infections with other pathogens, and should not  be used as the sole basis for treatment or other patient management decisions. Negative results must be combined with clinical observations, patient history, and epidemiological information. The expected result is Negative.  Fact Sheet for Patients: SugarRoll.be  Fact Sheet for Healthcare Providers: https://www.woods-mathews.com/  This test is not yet approved or cleared by the [place] FDA and  has been authorized for detection and/or diagnosis of SARS-CoV-2 by FDA under an Emergency Use Authorization (EUA). This  EUA will remain  in effect (meaning this test can be used) for the duration of the COVID-19 declaration under Se                          ction 564(b)(1) of the Act, 21 U.S.C. section 360bbb-3(b)(1), unless the authorization is terminated or revoked sooner.  Performed at [facility], [place] [address], [place], [place] [address]    WBC 10/05/2020 13.0 (A) 4.0 - 10.5 K/uL Final   RBC 10/05/2020 3.20 (A) 3.87 - 5.11 MIL/uL Final   Hemoglobin 10/05/2020 10.5 (A) 12.0 - 15.0 g/dL Final   HCT 10/05/2020 32.4 (A) 36.0 - 46.0 % Final   MCV 10/05/2020 101.3 (A) 80.0 - 100.0 fL Final   MCH 10/05/2020 32.8  26.0 - 34.0 pg Final   MCHC 10/05/2020 32.4  30.0 - 36.0 g/dL Final   RDW 10/05/2020 12.4  11.5 - 15.5 % Final   Platelets 10/05/2020 169  150 - 400 K/uL Final   nRBC 10/05/2020 0.0  0.0 - 0.2 % Final   Performed at [facility], [place] [address] [place], [place] [address]  Hospital Outpatient Visit on 09/29/2020  Component Date Value Ref Range Status   SARS Coronavirus 2 09/29/2020 NEGATIVE  NEGATIVE Final   Comment: (NOTE) SARS-CoV-2 target nucleic acids are NOT DETECTED.  The SARS-CoV-2 RNA is generally detectable in upper and lower respiratory specimens during the acute phase of infection. Negative results do not preclude SARS-CoV-2 infection, do not rule out co-infections with other pathogens, and should not be used as the sole basis for treatment or other patient management decisions. Negative results must be combined with clinical observations, patient history, and epidemiological information. The expected result is Negative.  Fact Sheet for Patients: SugarRoll.be  Fact Sheet for Healthcare Providers: https://www.woods-mathews.com/  This test is not yet approved or cleared by the [place] FDA and  has been authorized for detection and/or diagnosis of SARS-CoV-2 by FDA under an Emergency Use Authorization  (EUA). This EUA will remain  in effect (meaning this test can be used) for the duration of the COVID-19 declaration under Se                          ction 564(b)(1) of the Act, 21 U.S.C. section 360bbb-3(b)(1), unless the authorization is terminated or revoked sooner.  Performed at [facility], [place] [address] [place], [place] [address]   Hospital Outpatient Visit on 09/29/2020  Component Date Value Ref Range Status   MRSA, PCR 09/29/2020 NEGATIVE  NEGATIVE Final   Staphylococcus aureus 09/29/2020 NEGATIVE  NEGATIVE Final   Comment: (NOTE) The Xpert SA Assay (FDA approved for NASAL specimens in patients 25 years of age and older), is one component of a comprehensive surveillance program. It is not intended to diagnose infection nor to guide or monitor treatment. Performed at [facility], [place] [address], [place],  [address]    WBC  09/29/2020 4.8  4.0 - 10.5 K/uL Final   RBC 09/29/2020 3.99  3.87 - 5.11 MIL/uL Final   Hemoglobin 09/29/2020 13.2  12.0 - 15.0 g/dL Final   HCT 09/29/2020 40.5  36.0 - 46.0 % Final   MCV 09/29/2020 101.5 (A) 80.0 - 100.0 fL Final   MCH 09/29/2020 33.1  26.0 - 34.0 pg Final   MCHC 09/29/2020 32.6  30.0 - 36.0 g/dL Final   RDW 09/29/2020 12.1  11.5 - 15.5 % Final   Platelets 09/29/2020 228  150 - 400 K/uL Final   nRBC 09/29/2020 0.0  0.0 - 0.2 % Final   Performed at [facility], [place] [address], [place], [place] [address]   Sodium 09/29/2020 140  135 - 145 mmol/L Final   Potassium 09/29/2020 4.6  3.5 - 5.1 mmol/L Final   Chloride 09/29/2020 105  98 - 111 mmol/L Final   CO2 09/29/2020 28  22 - 32 mmol/L Final   Glucose, Bld 09/29/2020 90  70 - 99 mg/dL Final   Glucose reference range applies only to samples taken after fasting for at least 8 hours.   BUN 09/29/2020 17  8 - 23 mg/dL Final   Creatinine, Ser 09/29/2020 0.66  0.44 - 1.00 mg/dL Final   Calcium 09/29/2020 9.5  8.9 - 10.3 mg/dL Final    Total Protein 09/29/2020 7.1  6.5 - 8.1 g/dL Final   Albumin 09/29/2020 4.2  3.5 - 5.0 g/dL Final   AST 09/29/2020 21  15 - 41 U/L Final   ALT 09/29/2020 17  0 - 44 U/L Final   Alkaline Phosphatase 09/29/2020 49  38 - 126 U/L Final   Total Bilirubin 09/29/2020 0.8  0.3 - 1.2 mg/dL Final   GFR, Estimated 09/29/2020 >60  >60 mL/min Final   Comment: (NOTE) Calculated using the CKD-EPI Creatinine Equation (2021)    Anion gap 09/29/2020 7  5 - 15 Final   Performed at [facility], [place] [address], [place], [place] [address]   ABO/RH(D) 09/29/2020 A POS   Final   Antibody Screen 09/29/2020 NEG   Final   Sample Expiration 09/29/2020 10/06/2020,2359   Final   Extend sample reason 09/29/2020    Final                   Value:NO TRANSFUSIONS OR PREGNANCY IN THE PAST 3 MONTHS Performed at [place] [address] [place], [place] [address]      X-Rays:DG Pelvis Portable  Result Date: 10/03/2020 CLINICAL DATA:  Right hip replacement. EXAM: PORTABLE PELVIS 1-2 VIEWS COMPARISON:  None. FINDINGS: Right hip arthroplasty in expected alignment. There is no periprosthetic lucency or fracture. Recent postsurgical change includes air and edema in the soft tissues. The remainder of the bony pelvis is intact. IMPRESSION: Right hip arthroplasty without immediate postoperative complication. Electronically Signed   By: Keith Rake M.D.   On: 10/03/2020 17:46   DG C-Arm 1-60 Min-No Report  Result Date: 10/03/2020 Fluoroscopy was utilized by the requesting physician.  No radiographic interpretation.   DG HIP OPERATIVE UNILAT W OR W/O PELVIS RIGHT  Result Date: 10/03/2020 CLINICAL DATA:  Right hip replacement. EXAM: OPERATIVE RIGHT HIP (WITH PELVIS IF PERFORMED) TECHNIQUE: Fluoroscopic spot image(s) were submitted for interpretation post-operatively. COMPARISON:  Preoperative radiograph 07/20/2020 FINDINGS: Four fluoroscopic spot views of the pelvis and right hip obtained  in the operating room. Placement of right hip arthroplasty. Total fluoroscopy time 11 seconds. IMPRESSION: Procedural fluoroscopy  for right hip arthroplasty. Electronically Signed   By: Keith Rake M.D.   On: 10/03/2020 16:48    EKG: Orders placed or performed during the hospital encounter of 07/20/20   ED EKG   ED EKG   EKG     Hospital Course: RALEY KALLER is a 80 y.o. who was admitted to [facility]. They were brought to the operating room on 10/03/2020 and underwent Procedure(s): [place].  Patient tolerated the procedure well and was later transferred to the recovery room and then to the orthopaedic floor for postoperative care. They were given PO and IV analgesics for pain control following their surgery. They were given 24 hours of postoperative antibiotics of  Anti-infectives (From admission, onward)    Start     Dose/Rate Route Frequency Ordered Stop   10/03/20 2100  ceFAZolin (ANCEF) IVPB 2g/100 mL premix        2 g 200 mL/hr over 30 Minutes Intravenous Every 6 hours 10/03/20 1815 10/04/20 0246   10/03/20 1200  ceFAZolin (ANCEF) IVPB 2g/100 mL premix        2 g 200 mL/hr over 30 Minutes Intravenous On call to O.R. 10/03/20 1157 10/03/20 1525      and started on DVT prophylaxis in the form of Aspirin.   PT and OT were ordered for total joint protocol. Discharge planning consulted to help with postop disposition and equipment needs. Patient had a good night on the evening of surgery. They started to get up OOB with therapy on POD #1.  Continued to work with therapy into POD #2. Pt was seen during rounds on day two and was ready to go to SNF pending progress with therapy. Pt worked with therapy for one additional session and was meeting their goals. She was discharged to [facility] later that day in stable condition.  Diet: Regular diet Activity: WBAT Follow-up: in 2 weeks Disposition: Skilled nursing facility Discharged  Condition: good   Discharge Instructions     Call MD / Call 911   Complete by: As directed    If you experience chest pain or shortness of breath, CALL 911 and be transported to the hospital emergency room.  If you develope a fever above 101 F, pus (white drainage) or increased drainage or redness at the wound, or calf pain, call your surgeon's office.   Change dressing   Complete by: As directed    Maintain surgical dressing until follow up in the clinic. If the edges start to pull up, may reinforce with tape. If the dressing is no longer working, may remove and cover with gauze and tape, but must keep the area dry and clean.  Call with any questions or concerns.   Constipation Prevention   Complete by: As directed    Drink plenty of fluids.  Prune juice may be helpful.  You may use a stool softener, such as Colace (over the counter) 100 mg twice a day.  Use MiraLax (over the counter) for constipation as needed.   Diet - low sodium heart healthy   Complete by: As directed    Increase activity slowly as tolerated   Complete by: As directed    Weight bearing as tolerated with assist device (walker, cane, etc) as directed, use it as long as suggested by your surgeon or therapist, typically at least 4-6 weeks.   Post-operative opioid taper instructions:   Complete by: As directed    POST-OPERATIVE OPIOID TAPER INSTRUCTIONS: It  is important to wean off of your opioid medication as soon as possible. If you do not need pain medication after your surgery it is ok to stop day one. Opioids include: Codeine, Hydrocodone(Norco, Vicodin), Oxycodone(Percocet, oxycontin) and hydromorphone amongst others.  Long term and even short term use of opiods can cause: Increased pain response Dependence Constipation Depression Respiratory depression And more.  Withdrawal symptoms can include Flu like symptoms Nausea, vomiting And more Techniques to manage these symptoms Hydrate well Eat regular healthy  meals Stay active Use relaxation techniques(deep breathing, meditating, yoga) Do Not substitute Alcohol to help with tapering If you have been on opioids for less than two weeks and do not have pain than it is ok to stop all together.  Plan to wean off of opioids This plan should start within one week post op of your joint replacement. Maintain the same interval or time between taking each dose and first decrease the dose.  Cut the total daily intake of opioids by one tablet each day Next start to increase the time between doses. The last dose that should be eliminated is the evening dose.      TED hose   Complete by: As directed    Use stockings (TED hose) for 2 weeks on both leg(s).  You may remove them at night for sleeping.      Allergies as of 10/05/2020   No Known Allergies      Medication List     TAKE these medications    acetaminophen 500 MG tablet Commonly known as: TYLENOL Take 1,000 mg by mouth every 8 (eight) hours as needed for mild pain.   aspirin 81 MG chewable tablet Chew 1 tablet (81 mg total) by mouth 2 (two) times daily for 28 days.   celecoxib 200 MG capsule Commonly known as: CELEBREX Take 1 capsule (200 mg total) by mouth 2 (two) times daily.   docusate sodium 100 MG capsule Commonly known as: COLACE Take 1 capsule (100 mg total) by mouth 2 (two) times daily.   methocarbamol 500 MG tablet Commonly known as: ROBAXIN Take 1 tablet (500 mg total) by mouth every 6 (six) hours as needed for muscle spasms.   multivitamin with minerals tablet Take 1 tablet by mouth daily.   polyethylene glycol 17 g packet Commonly known as: MIRALAX / GLYCOLAX Take 17 g by mouth daily as needed for mild constipation.               Discharge Care Instructions  (From admission, onward)           Start     Ordered   10/04/20 0000  Change dressing       Comments: Maintain surgical dressing until follow up in the clinic. If the edges start to pull up,  may reinforce with tape. If the dressing is no longer working, may remove and cover with gauze and tape, but must keep the area dry and clean.  Call with any questions or concerns.   10/04/20 0726            Follow-up Information     Paralee Cancel, MD. Schedule an appointment as soon as possible for a visit in 2 week(s).   Specialty: Orthopedic Surgery Contact information: [address] [place] [address] [phone]                 Signed: Griffith Citron, PA-C Orthopedic Surgery 10/05/2020, 7:51 AM

## 2020-10-05 NOTE — Progress Notes (Signed)
Report Given to [id number]

## 2020-10-05 NOTE — Plan of Care (Signed)
Patient Discharged

## 2020-10-05 NOTE — Progress Notes (Signed)
Provider:  Veleta Miners MD Location:  [facility] Room Number: 32 Place of Service:  SNF ([phone])  PCP: Virgie Dad, MD Patient Care Team: Virgie Dad, MD as PCP - General (Internal Medicine)  Extended Emergency Contact Information Primary Emergency Contact: [place] Phone: [phone] Mobile Phone: [phone] Relation: Daughter Preferred language: English Interpreter needed? No Secondary Emergency Contact: Dehoyos,David Address: [address] [place] [address] Johnnette Litter of [place] Phone: [phone] Work Phone: [phone] Mobile Phone: [phone] Relation: Son Preferred language: English  Code Status: DNR Goals of Care: Advanced Directive information Advanced Directives 10/05/2020  Does Patient Have a Medical Advance Directive? Yes  Type of Paramedic of [place];Living will  Does patient want to make changes to medical advance directive? No - Patient declined  Copy of [place] in Chart? No - copy requested      Chief Complaint  Patient presents with   New Admit To SNF    Admission to SNF    HPI: Patient is a 80 y.o. female seen today for admission to SNF for therapy  Admitted in the hospital from 8/2-8/4 for right hip arthroplasty   She was seen by Dr Alvan Dame for Right hip pain Underwent surgery on 08/02. Postop was uneventful except she had hallucinations and delirium due to narcotics. She states her pain is well controlled on Tylenol She was complaining of mild constipation. Not having any abdominal pain nausea Already putting weight on that leg.  Patient with Recent move to [place]  Her other issues had been to falls in the past leading to rib fracture and a thoracic fracture  Cognitive impairment Noticed by her son and daughter.  She just lost her husband last year to prostate cancer. Has stopped driving now.  Son is taking care of  her finances  Past Medical History:  Diagnosis Date   Arthritis    Past Surgical History:  Procedure Laterality Date   TOTAL HIP ARTHROPLASTY Right 10/03/2020   Procedure: TOTAL HIP ARTHROPLASTY ANTERIOR APPROACH;  Surgeon: Paralee Cancel, MD;  Location: [facility];  Service: Orthopedics;  Laterality: Right;    reports that she has quit smoking. She has never used smokeless tobacco. She reports current alcohol use. She reports that she does not use drugs. Social History   Socioeconomic History   Marital status: Widowed    Spouse name: Not on file   Number of children: Not on file   Years of education: Not on file   Highest education level: Not on file  Occupational History   Not on file  Tobacco Use   Smoking status: Former   Smokeless tobacco: Never  Vaping Use   Vaping Use: Never used  Substance and Sexual Activity   Alcohol use: Yes    Comment: rare   Drug use: Never   Sexual activity: Never  Other Topics Concern   Not on file  Social History Narrative   Patient is no longer a tobacco user after 5 years, stopped 1 pack /day 1961   Patient drinks 253m of wine daily   Diet is balanced, limited meat   No caffeine consumption.   Married in 1964-now widowed   Lives in apartment alone, 1 story   B.A. 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    Difficulty bathing/dressing   Social Determinants of Health   Financial Resource Strain: Not on file  Food Insecurity: Not on file  Transportation Needs: Not on file  Physical Activity: Not on file  Stress: Not on file  Social Connections: Not on file  Intimate Partner Violence: Not on file    Functional Status Survey:    Family History  Problem Relation Age of Onset   Healthy Mother    Parkinson's disease Mother     Healthy Father    Cancer Father     Health Maintenance  Topic Date Due   Zoster Vaccines- Shingrix (1 of 2) Never done   DEXA SCAN  Never done   TETANUS/TDAP  03/04/2017   COVID-19 Vaccine (4 - Booster for Moderna series) 05/02/2020   INFLUENZA VACCINE  10/02/2020   PNA vac Low Risk Adult (2 of 2 - PPSV23) 08/03/2021   HPV VACCINES  Aged Out    Allergies  Allergen Reactions   Hydrocodone     Patient got very confused with Hallucinations    Outpatient Encounter Medications as of 10/05/2020  Medication Sig   acetaminophen (TYLENOL) 500 MG tablet Take 1,000 mg by mouth every 8 (eight) hours as needed for mild pain.   aspirin 81 MG chewable tablet Chew 1 tablet (81 mg total) by mouth 2 (two) times daily for 28 days.   celecoxib (CELEBREX) 200 MG capsule Take 1 capsule (200 mg total) by mouth 2 (two) times daily.   docusate sodium (COLACE) 100 MG capsule Take 1 capsule (100 mg total) by mouth 2 (two) times daily.   methocarbamol (ROBAXIN) 500 MG tablet Take 1 tablet (500 mg total) by mouth every 6 (six) hours as needed for muscle spasms.   Multiple Vitamins-Minerals (MULTIVITAMIN WITH MINERALS) tablet Take 1 tablet by mouth daily.   polyethylene glycol (MIRALAX / GLYCOLAX) 17 g packet Take 17 g by mouth daily as needed for mild constipation.   zinc oxide 20 % ointment Apply 1 application topically as needed for irritation.   No facility-administered encounter medications on file as of 10/05/2020.    Review of Systems  Constitutional:  Positive for activity change.  HENT: Negative.    Respiratory: Negative.    Cardiovascular: Negative.   Gastrointestinal:  Positive for constipation.  Genitourinary: Negative.   Musculoskeletal:  Positive for gait problem.  Skin: Negative.   Neurological:  Positive for weakness.  Psychiatric/Behavioral: Negative.     Vitals:   10/05/20 1642  BP: 125/64  Pulse: 72  Resp: 16  Temp: 97.7 F (36.5 C)  SpO2: 96%  Weight: 111 lb 8 oz (50.6 kg)   Height: '5\' 7"'$  (1.702 m)   Body mass index is 17.46 kg/m. Physical Exam Constitutional: Oriented to person, place, and time. Well-developed and well-nourished.  HENT:  Head: Normocephalic.  Mouth/Throat: Oropharynx is clear and moist.  Eyes: Pupils are equal, round, and reactive to light.  Neck: Neck supple.  Cardiovascular: Normal rate and normal heart sounds.  No murmur heard. Pulmonary/Chest: Effort normal and breath sounds normal. No respiratory distress. No wheezes. She has no rales.  Abdominal: Soft. Bowel sounds are normal. No distension. There is no tenderness. There is no rebound.  Musculoskeletal: No edema.  Lymphadenopathy: none Neurological: Alert and oriented to person, place, and time.  Skin: Skin is warm and dry.  Psychiatric: Normal mood and affect. Behavior is normal. Thought content normal.   Labs  reviewed: Basic Metabolic Panel: Recent Labs    07/20/20 1008 09/29/20 0950 10/04/20 0314  NA 141 140 138  K 3.8 4.6 4.2  CL 106 105 106  CO2 '27 28 24  '$ GLUCOSE 99 90 166*  BUN '21 17 12  '$ CREATININE 0.77 0.66 0.76  CALCIUM 9.3 9.5 8.4*   Liver Function Tests: Recent Labs    09/29/20 0950  AST 21  ALT 17  ALKPHOS 49  BILITOT 0.8  PROT 7.1  ALBUMIN 4.2   No results for input(s): LIPASE, AMYLASE in the last 8760 hours. No results for input(s): AMMONIA in the last 8760 hours. CBC: Recent Labs    09/29/20 0950 10/04/20 0314 10/05/20 0331  WBC 4.8 10.7* 13.0*  HGB 13.2 9.7* 10.5*  HCT 40.5 29.9* 32.4*  MCV 101.5* 101.0* 101.3*  PLT 228 160 169   Cardiac Enzymes: No results for input(s): CKTOTAL, CKMB, CKMBINDEX, TROPONINI in the last 8760 hours. BNP: Invalid input(s): POCBNP No results found for: HGBA1C No results found for: TSH No results found for: VITAMINB12 No results found for: FOLATE No results found for: IRON, TIBC, FERRITIN  Imaging and Procedures obtained prior to SNF admission: No results found.  Assessment/Plan Status post  total replacement of right hip Pain Control with Tylenol Robaxin and Celebrex Had bad reaction to Narcotics and will list that as allergy WBAT Already walking Aspirin for DVT Cognitive impairment Per her family Will provid supportive care for now DNR (do not resuscitate) discussion Discussed with Family in room Orders Placed  Anemia, unspecified type Post op Repeat CBC in 1 week  constipation Started on Senna Family/ staff Communication:   Labs/tests ordered: CBC,CMP in 1 week

## 2020-10-05 NOTE — Progress Notes (Signed)
   Subjective: 2 Days Post-Op Procedure(s) (LRB): TOTAL HIP ARTHROPLASTY ANTERIOR APPROACH (Right) Patient reports pain as mild.   Patient seen in rounds for Dr. Alvan Dame. Patient is well, and has had no acute complaints or problems. She felt that the tramadol made her feel a little disoriented and has done well with tylenol. She is planning to go to SNF at [facility] today.    Objective: Vital signs in last 24 hours: Temp:  [97.8 F (36.6 C)-98.6 F (37 C)] 97.8 F (36.6 C) (08/04 0551) Pulse Rate:  [67-87] 70 (08/04 0551) Resp:  [16-17] 16 (08/04 0551) BP: (95-150)/(53-82) 95/82 (08/04 0551) SpO2:  [100 %] 100 % (08/04 0551)  Intake/Output from previous day:  Intake/Output Summary (Last 24 hours) at 10/05/2020 0749 Last data filed at 10/05/2020 0600 Gross per 24 hour  Intake 600 ml  Output 500 ml  Net 100 ml     Intake/Output this shift: No intake/output data recorded.  Labs: Recent Labs    10/04/20 0314 10/05/20 0331  HGB 9.7* 10.5*   Recent Labs    10/04/20 0314 10/05/20 0331  WBC 10.7* 13.0*  RBC 2.96* 3.20*  HCT 29.9* 32.4*  PLT 160 169   Recent Labs    10/04/20 0314  NA 138  K 4.2  CL 106  CO2 24  BUN 12  CREATININE 0.76  GLUCOSE 166*  CALCIUM 8.4*   No results for input(s): LABPT, INR in the last 72 hours.  Exam: General - Patient is Alert and Oriented Extremity - Neurologically intact Sensation intact distally Intact pulses distally Dorsiflexion/Plantar flexion intact Dressing - dressing C/D/I Motor Function - intact, moving foot and toes well on exam.   Past Medical History:  Diagnosis Date   Arthritis     Assessment/Plan: 2 Days Post-Op Procedure(s) (LRB): TOTAL HIP ARTHROPLASTY ANTERIOR APPROACH (Right) Active Problems:   S/P right total hip arthroplasty  Estimated body mass index is 17.48 kg/m as calculated from the following:   Height as of this encounter: '5\' 7"'$  (1.702 m).   Weight as of this encounter: 50.6 kg. Advance  diet Up with therapy D/C IV fluids  DVT Prophylaxis - Aspirin Weight bearing as tolerated.  Plan is to go SNF today. Prescriptions are printed. Follow up in the office in 2 weeks.   Griffith Citron, PA-C Orthopedic Surgery [phone] 10/05/2020, 7:49 AM

## 2020-10-13 ENCOUNTER — Non-Acute Institutional Stay (SKILLED_NURSING_FACILITY): Payer: Medicare PPO | Admitting: Nurse Practitioner

## 2020-10-13 ENCOUNTER — Encounter: Payer: Self-pay | Admitting: Nurse Practitioner

## 2020-10-13 DIAGNOSIS — G3184 Mild cognitive impairment, so stated: Secondary | ICD-10-CM

## 2020-10-13 DIAGNOSIS — M8949 Other hypertrophic osteoarthropathy, multiple sites: Secondary | ICD-10-CM | POA: Diagnosis not present

## 2020-10-13 DIAGNOSIS — D500 Iron deficiency anemia secondary to blood loss (chronic): Secondary | ICD-10-CM | POA: Diagnosis not present

## 2020-10-13 DIAGNOSIS — K5901 Slow transit constipation: Secondary | ICD-10-CM

## 2020-10-13 DIAGNOSIS — R609 Edema, unspecified: Secondary | ICD-10-CM | POA: Diagnosis not present

## 2020-10-13 DIAGNOSIS — M159 Polyosteoarthritis, unspecified: Secondary | ICD-10-CM

## 2020-10-13 NOTE — Progress Notes (Signed)
Location:   [place] Room Number: [place] of Service:  SNF (31)  Provider: Luwana Butrick Otho Darner, NP  PCP: Virgie Dad, MD Patient Care Team: Virgie Dad, MD as PCP - General (Internal Medicine)  Extended Emergency Contact Information Primary Emergency Contact: [place] Phone: [phone] Mobile Phone: [phone] Relation: Daughter Preferred language: English Interpreter needed? No Secondary Emergency Contact: Ricklefs,David Address: [address] [place] [address] Johnnette Litter of [place] Phone: [phone] Work Phone: [phone] Mobile Phone: [phone] Relation: Son Preferred language: English  Code Status: DNR Goals of care:  Advanced Directive information Advanced Directives 10/13/2020  Does Patient Have a Medical Advance Directive? Yes  Type of Paramedic of [place];Living will  Does patient want to make changes to medical advance directive? No - Patient declined  Copy of [place] in Chart? Yes - validated most recent copy scanned in chart (See row information)     Allergies  Allergen Reactions   Hydrocodone     Patient got very confused with Hallucinations    Chief Complaint  Patient presents with   Transitions Of Care    Patient being discharged from independent living to skilled nursing facility    HPI:  80 y.o. female with medical history of OA, fall, mild cognitive deficit was admitted to [facility] for therapy following hospital stay 10/03/20-10/05/20 for the right total hip arthroplasty, pain is much improved. The patient has regained her physical strength, mobility, and ADL function. She is stable to return [place] [facility] to continue therapy.   Post op anemia, Hgb 9.7>>10.5 10/05/20  Constipation, takes Senokot S, prn MiraLax., Colace.   Edema, minimal  Hx of fall, rib fractures, a thoracic vertebra fx Cognitive impairment, mild.    Past Medical History:   Diagnosis Date   Arthritis     Past Surgical History:  Procedure Laterality Date   TOTAL HIP ARTHROPLASTY Right 10/03/2020   Procedure: TOTAL HIP ARTHROPLASTY ANTERIOR APPROACH;  Surgeon: Paralee Cancel, MD;  Location: [facility]S;  Service: Orthopedics;  Laterality: Right;      reports that she has quit smoking. She has never used smokeless tobacco. She reports current alcohol use. She reports that she does not use drugs. Social History   Socioeconomic History   Marital status: Widowed    Spouse name: Not on file   Number of children: Not on file   Years of education: Not on file   Highest education level: Not on file  Occupational History   Not on file  Tobacco Use   Smoking status: Former   Smokeless tobacco: Never  Vaping Use   Vaping Use: Never used  Substance and Sexual Activity   Alcohol use: Yes    Comment: rare   Drug use: Never   Sexual activity: Never  Other Topics Concern   Not on file  Social History Narrative   Patient is no longer a tobacco user after 5 years, stopped 1 pack /day 1961   Patient drinks 222m of wine daily   Diet is balanced, limited meat   No caffeine consumption.   Married in 1964-now widowed   Lives in apartment alone, 1 story   B.A. 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    Difficulty bathing/dressing   Social Determinants of Health   Financial Resource Strain: Not on file  Food Insecurity: Not on file  Transportation Needs: Not on file  Physical Activity: Not on file  Stress: Not on file  Social Connections: Not on file  Intimate Partner Violence: Not on file   Functional Status Survey:    Allergies  Allergen Reactions   Hydrocodone     Patient got very confused with Hallucinations    Pertinent  Health Maintenance  Due  Topic Date Due   DEXA SCAN  Never done   INFLUENZA VACCINE  10/02/2020   PNA vac Low Risk Adult (2 of 2 - PPSV23) 08/03/2021    Medications: Allergies as of 10/13/2020       Reactions   Hydrocodone    Patient got very confused with Hallucinations        Medication List        Accurate as of October 13, 2020 11:59 PM. If you have any questions, ask your nurse or doctor.          STOP taking these medications    celecoxib 200 MG capsule Commonly known as: CELEBREX Stopped by: Michaeljames Milnes X Saba Neuman, NP       TAKE these medications    acetaminophen 500 MG tablet Commonly known as: TYLENOL Take 1,000 mg by mouth every 8 (eight) hours as needed for mild pain.   aspirin 81 MG chewable tablet Chew 1 tablet (81 mg total) by mouth 2 (two) times daily for 28 days.   docusate sodium 100 MG capsule Commonly known as: COLACE Take 1 capsule (100 mg total) by mouth 2 (two) times daily.   methocarbamol 500 MG tablet Commonly known as: ROBAXIN Take 1 tablet (500 mg total) by mouth every 6 (six) hours as needed for muscle spasms.   multivitamin with minerals tablet Take 1 tablet by mouth daily.   polyethylene glycol 17 g packet Commonly known as: MIRALAX / GLYCOLAX Take 17 g by mouth daily as needed for mild constipation.   senna-docusate 8.6-50 MG tablet Commonly known as: Senokot-S Take 1 tablet by mouth daily.   zinc oxide 20 % ointment Apply 1 application topically as needed for irritation.        Review of Systems  Constitutional:  Negative for activity change, appetite change and fever.  HENT:  Negative for congestion and trouble swallowing.   Eyes:  Negative for visual disturbance.  Respiratory:  Negative for cough, shortness of breath and wheezing.   Cardiovascular:  Positive for leg swelling.  Gastrointestinal:  Negative for abdominal pain, constipation, nausea and vomiting.  Genitourinary:  Negative for dysuria, frequency and urgency.  Musculoskeletal:   Positive for arthralgias and gait problem.  Skin:  Negative for color change.  Neurological:  Negative for speech difficulty, weakness and headaches.       Memory lapses.   Psychiatric/Behavioral:  Negative for confusion and sleep disturbance. The patient is not nervous/anxious.    Vitals:   10/13/20 1311  BP: 110/66  Pulse: 82  Resp: 19  Temp: 97.8 F (36.6 C)  SpO2: 94%  Weight: 114 lb 3.2 oz (51.8 kg)  Height: '5\' 7"'$  (1.702 m)   Body mass index is 17.89 kg/m. Physical Exam Vitals and nursing note reviewed.  Constitutional:      Appearance: Normal appearance.  HENT:     Head: Normocephalic and atraumatic.     Nose: Nose normal. No congestion or rhinorrhea.  Mouth/Throat:     Mouth: Mucous membranes are moist.  Eyes:     Extraocular Movements: Extraocular movements intact.     Conjunctiva/sclera: Conjunctivae normal.     Pupils: Pupils are equal, round, and reactive to light.  Cardiovascular:     Rate and Rhythm: Normal rate.     Heart sounds: No murmur heard. Abdominal:     General: Bowel sounds are normal.     Palpations: Abdomen is soft.     Tenderness: There is no abdominal tenderness.  Musculoskeletal:        General: Normal range of motion.     Cervical back: Normal range of motion and neck supple.     Right lower leg: Edema present.     Left lower leg: Edema present.     Comments: Minimal edema BLE  Skin:    General: Skin is warm and dry.  Neurological:     General: No focal deficit present.     Mental Status: She is alert. Mental status is at baseline.     Motor: No weakness.     Coordination: Coordination normal.     Gait: Gait abnormal.  Psychiatric:        Mood and Affect: Mood normal.        Behavior: Behavior normal.        Thought Content: Thought content normal.        Judgment: Judgment normal.    Labs reviewed: Basic Metabolic Panel: Recent Labs    07/20/20 1008 09/29/20 0950 10/04/20 0314  NA 141 140 138  K 3.8 4.6 4.2  CL 106  105 106  CO2 '27 28 24  '$ GLUCOSE 99 90 166*  BUN '21 17 12  '$ CREATININE 0.77 0.66 0.76  CALCIUM 9.3 9.5 8.4*   Liver Function Tests: Recent Labs    09/29/20 0950  AST 21  ALT 17  ALKPHOS 49  BILITOT 0.8  PROT 7.1  ALBUMIN 4.2   No results for input(s): LIPASE, AMYLASE in the last 8760 hours. No results for input(s): AMMONIA in the last 8760 hours. CBC: Recent Labs    09/29/20 0950 10/04/20 0314 10/05/20 0331  WBC 4.8 10.7* 13.0*  HGB 13.2 9.7* 10.5*  HCT 40.5 29.9* 32.4*  MCV 101.5* 101.0* 101.3*  PLT 228 160 169   Cardiac Enzymes: No results for input(s): CKTOTAL, CKMB, CKMBINDEX, TROPONINI in the last 8760 hours. BNP: Invalid input(s): POCBNP CBG: Recent Labs    07/20/20 0953  GLUCAP 93    Procedures and Imaging Studies During Stay: DG Pelvis Portable  Result Date: 10/03/2020 CLINICAL DATA:  Right hip replacement. EXAM: PORTABLE PELVIS 1-2 VIEWS COMPARISON:  None. FINDINGS: Right hip arthroplasty in expected alignment. There is no periprosthetic lucency or fracture. Recent postsurgical change includes air and edema in the soft tissues. The remainder of the bony pelvis is intact. IMPRESSION: Right hip arthroplasty without immediate postoperative complication. Electronically Signed   By: Keith Rake M.D.   On: 10/03/2020 17:46   DG C-Arm 1-60 Min-No Report  Result Date: 10/03/2020 Fluoroscopy was utilized by the requesting physician.  No radiographic interpretation.   DG HIP OPERATIVE UNILAT W OR W/O PELVIS RIGHT  Result Date: 10/03/2020 CLINICAL DATA:  Right hip replacement. EXAM: OPERATIVE RIGHT HIP (WITH PELVIS IF PERFORMED) TECHNIQUE: Fluoroscopic spot image(s) were submitted for interpretation post-operatively. COMPARISON:  Preoperative radiograph 07/20/2020 FINDINGS: Four fluoroscopic spot views of the pelvis and right hip obtained in the operating room. Placement of right hip arthroplasty. Total fluoroscopy  time 11 seconds. IMPRESSION: Procedural fluoroscopy  for right hip arthroplasty. Electronically Signed   By: Keith Rake M.D.   On: 10/03/2020 16:48    Assessment/Plan:   Osteoarthritis, multiple sites Hx of rib fracture, thoracic vertebra fracture, end stage R hip OA-s/p total hip replacement, continue therapy, may use prn Tylenol for pain   Blood loss anemia Hgb 9.7>>10.5 10/05/20  Slow transit constipation takes Senokot S, prn MiraLax., Colace.   Peripheral edema Minimal.   Mild cognitive impairment Functioning well [place] [facility] with assistance of her son.     Patient is being discharged with the following home health services:    Patient is being discharged with the following durable medical equipment:    Patient has been advised to f/u with their PCP in 1-2 weeks to bring them up to date on their rehab stay.  Social services at facility was responsible for arranging this appointment.  Pt was provided with a 30 day supply of prescriptions for medications and refills must be obtained from their PCP.  For controlled substances, a more limited supply may be provided adequate until PCP appointment only.  Future labs/tests needed:  prn

## 2020-10-17 ENCOUNTER — Encounter: Payer: Self-pay | Admitting: Nurse Practitioner

## 2020-10-17 DIAGNOSIS — K5901 Slow transit constipation: Secondary | ICD-10-CM | POA: Insufficient documentation

## 2020-10-17 DIAGNOSIS — M159 Polyosteoarthritis, unspecified: Secondary | ICD-10-CM | POA: Insufficient documentation

## 2020-10-17 DIAGNOSIS — R609 Edema, unspecified: Secondary | ICD-10-CM | POA: Insufficient documentation

## 2020-10-17 DIAGNOSIS — G3184 Mild cognitive impairment, so stated: Secondary | ICD-10-CM | POA: Insufficient documentation

## 2020-10-17 DIAGNOSIS — D500 Iron deficiency anemia secondary to blood loss (chronic): Secondary | ICD-10-CM | POA: Insufficient documentation

## 2020-10-17 DIAGNOSIS — R600 Localized edema: Secondary | ICD-10-CM | POA: Insufficient documentation

## 2020-10-17 NOTE — Assessment & Plan Note (Signed)
Functioning well [place] [facility] with assistance of her son.

## 2020-10-17 NOTE — Assessment & Plan Note (Signed)
takes Senokot S, prn MiraLax., Colace.

## 2020-10-17 NOTE — Assessment & Plan Note (Signed)
Minimal

## 2020-10-17 NOTE — Assessment & Plan Note (Signed)
Hgb 9.7>>10.5 10/05/20

## 2020-10-17 NOTE — Assessment & Plan Note (Signed)
Hx of rib fracture, thoracic vertebra fracture, end stage R hip OA-s/p total hip replacement, continue therapy, may use prn Tylenol for pain

## 2020-11-01 ENCOUNTER — Encounter: Payer: Self-pay | Admitting: Internal Medicine

## 2020-11-01 ENCOUNTER — Other Ambulatory Visit: Payer: Self-pay

## 2020-11-01 ENCOUNTER — Non-Acute Institutional Stay: Payer: Medicare PPO | Admitting: Internal Medicine

## 2020-11-01 VITALS — BP 110/72 | HR 83 | Temp 96.9°F | Ht 67.0 in | Wt 116.8 lb

## 2020-11-01 DIAGNOSIS — D500 Iron deficiency anemia secondary to blood loss (chronic): Secondary | ICD-10-CM

## 2020-11-01 DIAGNOSIS — K5901 Slow transit constipation: Secondary | ICD-10-CM | POA: Diagnosis not present

## 2020-11-01 DIAGNOSIS — G3184 Mild cognitive impairment, so stated: Secondary | ICD-10-CM

## 2020-11-01 DIAGNOSIS — Z96641 Presence of right artificial hip joint: Secondary | ICD-10-CM

## 2020-11-01 NOTE — Patient Instructions (Signed)
Calcium 600 mg With Vit D 1000 units once a day

## 2020-11-06 NOTE — Progress Notes (Signed)
Location:  [place] of Service:  Clinic (12)  Provider:   Code Status: DNR Goals of Care:  Advanced Directives 11/01/2020  Does Patient Have a Medical Advance Directive? Yes  Type of Paramedic of [place];Living will  Does patient want to make changes to medical advance directive? No - Patient declined  Copy of [place] in Chart? Yes - validated most recent copy scanned in chart (See row information)     Chief Complaint  Patient presents with   Medical Management of Chronic Issues    Patient returns to the clinic to discuss her recent hip replacement surgery.   Quality Metric Gaps    Shingrix, Dexa scan, TDAP, and flu shut    HPI: Patient is a 80 y.o. female seen today for medical management of chronic diseases.   Admitted in the hospital from 8/2-8/4 for right hip arthroplasty  She was seen by Dr Alvan Dame for Right hip pain Underwent surgery on 08/02. Postop was uneventful except she had hallucinations and delirium due to narcotics. Stayed in SNF for 1 week but now back in her apartment Continues to work with therapy Not using any cane or walker No Pain. No Falls  Patient with Recent move to [place]   Her other issues had been to falls in the past leading to rib fracture and a thoracic fracture  Cognitive impairment Noticed by her son and daughter.  She just lost her husband last year to prostate cancer. Has stopped driving now.  Son is taking care of her finances   Did not have any acute complain Adjusting to [facility].  Past Medical History:  Diagnosis Date   Arthritis     Past Surgical History:  Procedure Laterality Date   TOTAL HIP ARTHROPLASTY Right 10/03/2020   Procedure: TOTAL HIP ARTHROPLASTY ANTERIOR APPROACH;  Surgeon: Paralee Cancel, MD;  Location: [facility];  Service: Orthopedics;  Laterality: Right;    Allergies  Allergen Reactions   Hydrocodone     Patient got very confused with  Hallucinations    Outpatient Encounter Medications as of 11/01/2020  Medication Sig   acetaminophen (TYLENOL) 500 MG tablet Take 1,000 mg by mouth every 8 (eight) hours as needed for mild pain.   [EXPIRED] aspirin 81 MG chewable tablet Chew 1 tablet (81 mg total) by mouth 2 (two) times daily for 28 days.   Multiple Vitamins-Minerals (MULTIVITAMIN WITH MINERALS) tablet Take 1 tablet by mouth daily.   polyethylene glycol (MIRALAX / GLYCOLAX) 17 g packet Take 17 g by mouth daily as needed for mild constipation.   senna-docusate (SENOKOT-S) 8.6-50 MG tablet Take 1 tablet by mouth daily.   zinc oxide 20 % ointment Apply 1 application topically as needed for irritation.   [DISCONTINUED] docusate sodium (COLACE) 100 MG capsule Take 1 capsule (100 mg total) by mouth 2 (two) times daily.   [DISCONTINUED] methocarbamol (ROBAXIN) 500 MG tablet Take 1 tablet (500 mg total) by mouth every 6 (six) hours as needed for muscle spasms.   No facility-administered encounter medications on file as of 11/01/2020.    Review of Systems:  Review of Systems Review of Systems  Constitutional: Negative for activity change, appetite change, chills, diaphoresis, fatigue and fever.  HENT: Negative for mouth sores, postnasal drip, rhinorrhea, sinus pain and sore throat.   Respiratory: Negative for apnea, cough, chest tightness, shortness of breath and wheezing.   Cardiovascular: Negative for chest pain, palpitations and leg swelling.  Gastrointestinal: Negative for abdominal distention, abdominal pain, constipation, diarrhea, nausea and vomiting.  Genitourinary: Negative for dysuria and frequency.  Musculoskeletal: Negative for arthralgias, joint swelling and myalgias.  Skin: Negative for rash.  Neurological: Negative for dizziness, syncope, weakness, light-headedness and numbness.  Psychiatric/Behavioral: Negative for behavioral problems, confusion and sleep disturbance.   Health Maintenance  Topic Date Due   Zoster  Vaccines- Shingrix (1 of 2) Never done   DEXA SCAN  Never done   TETANUS/TDAP  03/04/2017   INFLUENZA VACCINE  10/02/2020   PNA vac Low Risk Adult (2 of 2 - PPSV23) 08/03/2021   COVID-19 Vaccine  Completed   HPV VACCINES  Aged Out    Physical Exam: Vitals:   11/01/20 1340  BP: 110/72  Pulse: 83  Temp: (!) 96.9 F (36.1 C)  SpO2: 99%  Weight: 116 lb 12.8 oz (53 kg)  Height: '5\' 7"'$  (1.702 m)   Body mass index is 18.29 kg/m. Physical Exam Constitutional: Oriented to person, place, and time. Well-developed and well-nourished.  HENT:  Head: Normocephalic.  Mouth/Throat: Oropharynx is clear and moist.  Eyes: Pupils are equal, round, and reactive to light.  Neck: Neck supple.  Cardiovascular: Normal rate and normal heart sounds.  No murmur heard. Pulmonary/Chest: Effort normal and breath sounds normal. No respiratory distress. No wheezes. She has no rales.  Abdominal: Soft. Bowel sounds are normal. No distension. There is no tenderness. There is no rebound.  Musculoskeletal: No edema.  Lymphadenopathy: none Neurological: Alert and oriented to person, place, and time.  Skin: Skin is warm and dry.  Psychiatric: Normal mood and affect. Behavior is normal. Thought content normal.   Labs reviewed: Basic Metabolic Panel: Recent Labs    07/20/20 1008 09/29/20 0950 10/04/20 0314  NA 141 140 138  K 3.8 4.6 4.2  CL 106 105 106  CO2 '27 28 24  '$ GLUCOSE 99 90 166*  BUN '21 17 12  '$ CREATININE 0.77 0.66 0.76  CALCIUM 9.3 9.5 8.4*   Liver Function Tests: Recent Labs    09/29/20 0950  AST 21  ALT 17  ALKPHOS 49  BILITOT 0.8  PROT 7.1  ALBUMIN 4.2   No results for input(s): LIPASE, AMYLASE in the last 8760 hours. No results for input(s): AMMONIA in the last 8760 hours. CBC: Recent Labs    09/29/20 0950 10/04/20 0314 10/05/20 0331  WBC 4.8 10.7* 13.0*  HGB 13.2 9.7* 10.5*  HCT 40.5 29.9* 32.4*  MCV 101.5* 101.0* 101.3*  PLT 228 160 169   Lipid Panel: No results for  input(s): CHOL, HDL, LDLCALC, TRIG, CHOLHDL, LDLDIRECT in the last 8760 hours. No results found for: HGBA1C  Procedures since last visit: No results found.  Assessment/Plan  Status post total replacement of right hip Doing well Now walking with no support Blood loss anemia Repeat CBC in next visit Mild cognitive impairment Needs MMSe in Next visit Doing well Slow transit constipation Senna PRn Will start her on Calcium and Vit d Will need Vaccination reviwed and DEXA discussed in Next visit   Labs/tests ordered:  * No order type specified * Next appt:  02/08/2021

## 2020-12-06 ENCOUNTER — Encounter: Payer: Self-pay | Admitting: Internal Medicine

## 2021-02-07 ENCOUNTER — Other Ambulatory Visit: Payer: Self-pay | Admitting: Internal Medicine

## 2021-02-07 DIAGNOSIS — M159 Polyosteoarthritis, unspecified: Secondary | ICD-10-CM

## 2021-02-07 DIAGNOSIS — R609 Edema, unspecified: Secondary | ICD-10-CM

## 2021-02-07 DIAGNOSIS — D500 Iron deficiency anemia secondary to blood loss (chronic): Secondary | ICD-10-CM

## 2021-02-07 DIAGNOSIS — I10 Essential (primary) hypertension: Secondary | ICD-10-CM

## 2021-02-07 NOTE — Progress Notes (Signed)
Labs Ordered

## 2021-02-08 ENCOUNTER — Other Ambulatory Visit: Payer: Self-pay

## 2021-02-08 DIAGNOSIS — D500 Iron deficiency anemia secondary to blood loss (chronic): Secondary | ICD-10-CM

## 2021-02-08 DIAGNOSIS — R609 Edema, unspecified: Secondary | ICD-10-CM

## 2021-02-08 DIAGNOSIS — I10 Essential (primary) hypertension: Secondary | ICD-10-CM

## 2021-02-09 LAB — CBC WITH DIFFERENTIAL/PLATELET
Absolute Monocytes: 529 cells/uL (ref 200–950)
Basophils Absolute: 69 cells/uL (ref 0–200)
Basophils Relative: 1.1 %
Eosinophils Absolute: 170 cells/uL (ref 15–500)
Eosinophils Relative: 2.7 %
HCT: 41.6 % (ref 35.0–45.0)
Hemoglobin: 13.5 g/dL (ref 11.7–15.5)
Lymphs Abs: 1840 cells/uL (ref 850–3900)
MCH: 30.8 pg (ref 27.0–33.0)
MCHC: 32.5 g/dL (ref 32.0–36.0)
MCV: 95 fL (ref 80.0–100.0)
MPV: 10.6 fL (ref 7.5–12.5)
Monocytes Relative: 8.4 %
Neutro Abs: 3692 cells/uL (ref 1500–7800)
Neutrophils Relative %: 58.6 %
Platelets: 262 10*3/uL (ref 140–400)
RBC: 4.38 10*6/uL (ref 3.80–5.10)
RDW: 12.8 % (ref 11.0–15.0)
Total Lymphocyte: 29.2 %
WBC: 6.3 10*3/uL (ref 3.8–10.8)

## 2021-02-09 LAB — COMPLETE METABOLIC PANEL WITH GFR
AG Ratio: 1.4 (calc) (ref 1.0–2.5)
ALT: 11 U/L (ref 6–29)
AST: 16 U/L (ref 10–35)
Albumin: 4.1 g/dL (ref 3.6–5.1)
Alkaline phosphatase (APISO): 61 U/L (ref 37–153)
BUN: 12 mg/dL (ref 7–25)
CO2: 27 mmol/L (ref 20–32)
Calcium: 9.7 mg/dL (ref 8.6–10.4)
Chloride: 103 mmol/L (ref 98–110)
Creat: 0.71 mg/dL (ref 0.60–0.95)
Globulin: 3 g/dL (calc) (ref 1.9–3.7)
Glucose, Bld: 85 mg/dL (ref 65–99)
Potassium: 4.1 mmol/L (ref 3.5–5.3)
Sodium: 141 mmol/L (ref 135–146)
Total Bilirubin: 0.9 mg/dL (ref 0.2–1.2)
Total Protein: 7.1 g/dL (ref 6.1–8.1)
eGFR: 85 mL/min/{1.73_m2} (ref >=60)

## 2021-02-09 LAB — LIPID PANEL
Cholesterol: 238 mg/dL — ABNORMAL HIGH (ref <200)
HDL: 102 mg/dL (ref >=50)
LDL Cholesterol (Calc): 115 mg/dL (calc) — ABNORMAL HIGH
Non-HDL Cholesterol (Calc): 136 mg/dL (calc) — ABNORMAL HIGH (ref <130)
Total CHOL/HDL Ratio: 2.3 (calc) (ref <5.0)
Triglycerides: 104 mg/dL (ref <150)

## 2021-02-09 LAB — TSH: TSH: 2.28 mIU/L (ref 0.40–4.50)

## 2021-02-14 ENCOUNTER — Non-Acute Institutional Stay: Payer: Medicare PPO | Admitting: Internal Medicine

## 2021-02-14 ENCOUNTER — Other Ambulatory Visit: Payer: Self-pay

## 2021-02-14 ENCOUNTER — Encounter: Payer: Self-pay | Admitting: Internal Medicine

## 2021-02-14 VITALS — BP 149/72 | HR 80 | Temp 98.0°F | Ht 67.0 in | Wt 121.0 lb

## 2021-02-14 DIAGNOSIS — M8580 Other specified disorders of bone density and structure, unspecified site: Secondary | ICD-10-CM

## 2021-02-14 DIAGNOSIS — Z96641 Presence of right artificial hip joint: Secondary | ICD-10-CM

## 2021-02-14 DIAGNOSIS — G3184 Mild cognitive impairment, so stated: Secondary | ICD-10-CM | POA: Diagnosis not present

## 2021-02-14 DIAGNOSIS — M159 Polyosteoarthritis, unspecified: Secondary | ICD-10-CM

## 2021-02-14 DIAGNOSIS — M150 Primary generalized (osteo)arthritis: Secondary | ICD-10-CM

## 2021-02-14 DIAGNOSIS — D500 Iron deficiency anemia secondary to blood loss (chronic): Secondary | ICD-10-CM | POA: Diagnosis not present

## 2021-02-14 DIAGNOSIS — K5901 Slow transit constipation: Secondary | ICD-10-CM

## 2021-02-14 NOTE — Progress Notes (Signed)
Location:  [place] of Service:  Clinic (12)  Provider:   Code Status:  Goals of Care:  Advanced Directives 11/01/2020  Does Patient Have a Medical Advance Directive? Yes  Type of Paramedic of [place];Living will  Does patient want to make changes to medical advance directive? No - Patient declined  Copy of [place] in Chart? Yes - validated most recent copy scanned in chart (See row information)     Chief Complaint  Patient presents with   Medical Management of Chronic Issues    Patient here today for her 4 month follow up.    Quality Metric Gaps    Shingles, Dexa and TDAP    HPI: Patient is a 80 y.o. female seen today for medical management of chronic diseases.    Patient has h/o Mild Cognitive impairment Doing well in [facility] Walks with no assist now No Falls. Son and daughter are out of town  S/p Right Hip arthroplasty in 10/03/2020 Doing well No Pian or discomfort  Has h/o Rib Fracture after a fall in 05/22  Has stopped driving now.  Son is taking care of her finances No Acute complains   Past Medical History:  Diagnosis Date   Arthritis     Past Surgical History:  Procedure Laterality Date   TOTAL HIP ARTHROPLASTY Right 10/03/2020   Procedure: TOTAL HIP ARTHROPLASTY ANTERIOR APPROACH;  Surgeon: Paralee Cancel, MD;  Location: [facility];  Service: Orthopedics;  Laterality: Right;    Allergies  Allergen Reactions   Hydrocodone     Patient got very confused with Hallucinations    Outpatient Encounter Medications as of 02/14/2021  Medication Sig   acetaminophen (TYLENOL) 500 MG tablet Take 1,000 mg by mouth every 8 (eight) hours as needed for mild pain.   Multiple Vitamins-Minerals (MULTIVITAMIN WITH MINERALS) tablet Take 1 tablet by mouth daily.   polyethylene glycol (MIRALAX / GLYCOLAX) 17 g packet Take 17 g by mouth daily as needed for mild constipation.   [DISCONTINUED] senna-docusate  (SENOKOT-S) 8.6-50 MG tablet Take 1 tablet by mouth daily.   [DISCONTINUED] zinc oxide 20 % ointment Apply 1 application topically as needed for irritation.   No facility-administered encounter medications on file as of 02/14/2021.    Review of Systems:  Review of Systems  Constitutional:  Negative for activity change and appetite change.  HENT: Negative.    Respiratory:  Negative for cough and shortness of breath.   Cardiovascular:  Negative for leg swelling.  Gastrointestinal:  Negative for constipation.  Genitourinary: Negative.   Musculoskeletal:  Negative for arthralgias, gait problem and myalgias.  Skin: Negative.   Neurological:  Negative for dizziness and weakness.  Psychiatric/Behavioral:  Negative for confusion, dysphoric mood and sleep disturbance.    Health Maintenance  Topic Date Due   Zoster Vaccines- Shingrix (1 of 2) Never done   DEXA SCAN  Never done   TETANUS/TDAP  03/04/2017   COVID-19 Vaccine (4 - Booster for Moderna series) 09/28/2020   Pneumonia Vaccine 54+ Years old (2 - PPSV23 if available, else PCV20) 08/03/2021   INFLUENZA VACCINE  Completed   HPV VACCINES  Aged Out    Physical Exam: Vitals:   02/14/21 1553  BP: (!) 149/72  Pulse: 80  Temp: 98 F (36.7 C)  SpO2: 98%  Weight: 121 lb (54.9 kg)  Height: 5\' 7"  (1.702 m)   Body mass index is 18.95 kg/m. Physical Exam Vitals reviewed.  Constitutional:  Appearance: Normal appearance.  HENT:     Head: Normocephalic.     Nose: Nose normal.     Mouth/Throat:     Mouth: Mucous membranes are moist.     Pharynx: Oropharynx is clear.  Eyes:     Pupils: Pupils are equal, round, and reactive to light.  Cardiovascular:     Rate and Rhythm: Normal rate and regular rhythm.     Pulses: Normal pulses.     Heart sounds: Normal heart sounds. No murmur heard. Pulmonary:     Effort: Pulmonary effort is normal.     Breath sounds: Normal breath sounds.  Abdominal:     General: Abdomen is flat. Bowel  sounds are normal.     Palpations: Abdomen is soft.  Musculoskeletal:        General: No swelling.     Cervical back: Neck supple.  Skin:    General: Skin is warm.     Comments: 2 spots in her forehead and her Right leg Possible Keratosis  Neurological:     General: No focal deficit present.     Mental Status: She is alert and oriented to person, place, and time.     Comments: Does limp when she walks   Psychiatric:        Mood and Affect: Mood normal.        Thought Content: Thought content normal.    Labs reviewed: Basic Metabolic Panel: Recent Labs    09/29/20 0950 10/04/20 0314 02/08/21 0810  NA 140 138 141  K 4.6 4.2 4.1  CL 105 106 103  CO2 28 24 27   GLUCOSE 90 166* 85  BUN 17 12 12   CREATININE 0.66 0.76 0.71  CALCIUM 9.5 8.4* 9.7  TSH  --   --  2.28   Liver Function Tests: Recent Labs    09/29/20 0950 02/08/21 0810  AST 21 16  ALT 17 11  ALKPHOS 49  --   BILITOT 0.8 0.9  PROT 7.1 7.1  ALBUMIN 4.2  --    No results for input(s): LIPASE, AMYLASE in the last 8760 hours. No results for input(s): AMMONIA in the last 8760 hours. CBC: Recent Labs    10/04/20 0314 10/05/20 0331 02/08/21 0810  WBC 10.7* 13.0* 6.3  NEUTROABS  --   --  3,692  HGB 9.7* 10.5* 13.5  HCT 29.9* 32.4* 41.6  MCV 101.0* 101.3* 95.0  PLT 160 169 262   Lipid Panel: Recent Labs    02/08/21 0810  CHOL 238*  HDL 102  LDLCALC 115*  TRIG 104  CHOLHDL 2.3   No results found for: HGBA1C  Procedures since last visit: No results found.  Assessment/Plan  1. Blood loss anemia HGB now back to Normal  2. Status post total replacement of right hip Doing well No Falls  3. Mild cognitive impairment Stayign indepednet  MMSE next visit  4. Primary osteoarthritis involving multiple joints Tylenol PRN  5. Slow transit constipation Senna PRN  6. Osteopenia, unspecified location Calcium and Vit D DEXA ordered 7 Keratosis Have added to her list of facility  Dermatology  Need TDAP and Shingrix Does not want to continue Mammogram  Daughter just diagnosed with Breast cancer   Labs/tests ordered:  * No order type specified * Next appt:  08/09/2021

## 2021-02-14 NOTE — Patient Instructions (Addendum)
Start Calcium with Vit d  Can take Stool Softener for occasional Constipation Will put you on the list to see Dermatologist in [place] I have ordered DEXA scan in [place] to check Bone density  You are due for Shingrix shot and TDAP  [place] in: [facility] Address: [address] [place], [place], [place] [address] Phone: [phone]

## 2021-04-18 ENCOUNTER — Telehealth: Payer: Self-pay

## 2021-04-18 ENCOUNTER — Encounter: Payer: Self-pay | Admitting: Internal Medicine

## 2021-04-18 ENCOUNTER — Other Ambulatory Visit: Payer: Self-pay

## 2021-04-18 ENCOUNTER — Non-Acute Institutional Stay: Payer: Medicare PPO | Admitting: Internal Medicine

## 2021-04-18 VITALS — BP 112/71 | HR 103 | Temp 99.1°F | Ht 67.0 in | Wt 123.7 lb

## 2021-04-18 DIAGNOSIS — R1012 Left upper quadrant pain: Secondary | ICD-10-CM | POA: Diagnosis not present

## 2021-04-18 DIAGNOSIS — R82998 Other abnormal findings in urine: Secondary | ICD-10-CM | POA: Diagnosis not present

## 2021-04-18 DIAGNOSIS — R630 Anorexia: Secondary | ICD-10-CM

## 2021-04-18 DIAGNOSIS — R1011 Right upper quadrant pain: Secondary | ICD-10-CM

## 2021-04-18 NOTE — Telephone Encounter (Signed)
Patient called c/o of chest tightness   1. Where is the pain/tightness located? Lower chest area   2. Does the pain radiate? (left arm or jaw)? No  3. Any associated symptoms like shortness of breath,sweating,indigestion, or anxiety? Anxiety due to the fact that she does not know what is wrong. Patient also c/o dark urine (orange) and denies pain or discomfort when urinating  4. If patient with history of myocardial infarction/coronary artery disease does the pain feel like your previous episode/heart attack?. No   5. How long have you had the pain? Started today while in the swimming pool, lasted x 5 minutes or less. Pain/chest tightness has now subsided. Patient spoke with the nurse on site and was told to call Dr.Gupta. Patient does not drive and is unable to come to [facility] to be seen and Dr.Gupta's [facility] clinic is booked.   Please advise

## 2021-04-18 NOTE — Telephone Encounter (Signed)
She usually does not complain. Is there anyway she can be added at 1 pm slot ?

## 2021-04-18 NOTE — Patient Instructions (Addendum)
Drink Water  If pain comes back and does not get better go to ER Come tomorrow at 7.45 in the morning for Blood test and urine test

## 2021-04-18 NOTE — Progress Notes (Signed)
Location: [place] of Service:  Clinic (12)  Provider:   Code Status:  Goals of Care:  Advanced Directives 11/01/2020  Does Patient Have a Medical Advance Directive? Yes  Type of Paramedic of [place];Living will  Does patient want to make changes to medical advance directive? No - Patient declined  Copy of [place] in Chart? Yes - validated most recent copy scanned in chart (See row information)     Chief Complaint  Patient presents with   Acute Visit    HPI: Patient is a 81 y.o. female seen today for an acute visit for ? Upper abdominal Discomfort, Dark urine and Fatigue  Patient with h/o Patient has h/o Mild Cognitive impairment,S/p Right Hip arthroplasty in 10/03/2020  She says  has not been feeling well for past few days  No eating well. Not Drinking enough water Feeling tired Today she got n the pool for exercise and felt discomfort in her Upper abdomen Bilateral. No Diaphoresis or SOB no Cough or fever No Nausea or vomiting NO Diarrhea. Pain got resolved itself Lasted for few min She has also noticed that her Urine is dark color No Hematuria or dysuria No Fever or chills   Past Medical History:  Diagnosis Date   Arthritis     Past Surgical History:  Procedure Laterality Date   TOTAL HIP ARTHROPLASTY Right 10/03/2020   Procedure: TOTAL HIP ARTHROPLASTY ANTERIOR APPROACH;  Surgeon: Paralee Cancel, MD;  Location: [facility];  Service: Orthopedics;  Laterality: Right;    Allergies  Allergen Reactions   Hydrocodone     Patient got very confused with Hallucinations    Outpatient Encounter Medications as of 04/18/2021  Medication Sig   acetaminophen (TYLENOL) 500 MG tablet Take 1,000 mg by mouth every 8 (eight) hours as needed for mild pain.   Multiple Vitamins-Minerals (MULTIVITAMIN WITH MINERALS) tablet Take 1 tablet by mouth daily.   Polyethyl Glycol-Propyl Glycol (SYSTANE) 0.4-0.3 % SOLN Apply to  eye.   polyethylene glycol (MIRALAX / GLYCOLAX) 17 g packet Take 17 g by mouth daily as needed for mild constipation.   No facility-administered encounter medications on file as of 04/18/2021.    Review of Systems:  Review of Systems  Constitutional:  Positive for activity change and appetite change.  HENT: Negative.    Respiratory:  Negative for cough and shortness of breath.   Cardiovascular:  Negative for leg swelling.  Gastrointestinal:  Negative for constipation.  Genitourinary: Negative.  Negative for dysuria, flank pain, hematuria, pelvic pain and urgency.  Musculoskeletal:  Negative for arthralgias, gait problem and myalgias.  Skin: Negative.   Neurological:  Negative for dizziness and weakness.  Psychiatric/Behavioral:  Negative for confusion, dysphoric mood and sleep disturbance.    Health Maintenance  Topic Date Due   Zoster Vaccines- Shingrix (1 of 2) Never done   DEXA SCAN  Never done   TETANUS/TDAP  03/04/2017   COVID-19 Vaccine (4 - Booster for Moderna series) 09/28/2020   Pneumonia Vaccine 53+ Years old (2 - PPSV23 if available, else PCV20) 08/03/2021   INFLUENZA VACCINE  Completed   HPV VACCINES  Aged Out    Physical Exam: Vitals:   04/18/21 1319  BP: 112/71  Pulse: (!) 103  Temp: 99.1 F (37.3 C)  SpO2: 100%  Weight: 123 lb 11.2 oz (56.1 kg)  Height: 5\' 7"  (1.702 m)   Body mass index is 19.37 kg/m. Physical Exam Vitals reviewed.  Constitutional:  Appearance: Normal appearance.  HENT:     Head: Normocephalic.     Nose: Nose normal.     Mouth/Throat:     Mouth: Mucous membranes are moist.     Pharynx: Oropharynx is clear.  Eyes:     Pupils: Pupils are equal, round, and reactive to light.  Cardiovascular:     Rate and Rhythm: Normal rate and regular rhythm.     Pulses: Normal pulses.     Heart sounds: Normal heart sounds. No murmur heard. Pulmonary:     Effort: Pulmonary effort is normal.     Breath sounds: Normal breath sounds.   Abdominal:     General: Abdomen is flat. Bowel sounds are normal.     Palpations: Abdomen is soft.  Musculoskeletal:        General: No swelling.     Cervical back: Neck supple.  Skin:    General: Skin is warm.  Neurological:     General: No focal deficit present.     Mental Status: She is alert and oriented to person, place, and time.     Comments: Walks with Limp. Baseline gait  Psychiatric:        Mood and Affect: Mood normal.        Thought Content: Thought content normal.    Labs reviewed: Basic Metabolic Panel: Recent Labs    09/29/20 0950 10/04/20 0314 02/08/21 0810  NA 140 138 141  K 4.6 4.2 4.1  CL 105 106 103  CO2 28 24 27   GLUCOSE 90 166* 85  BUN 17 12 12   CREATININE 0.66 0.76 0.71  CALCIUM 9.5 8.4* 9.7  TSH  --   --  2.28   Liver Function Tests: Recent Labs    09/29/20 0950 02/08/21 0810  AST 21 16  ALT 17 11  ALKPHOS 49  --   BILITOT 0.8 0.9  PROT 7.1 7.1  ALBUMIN 4.2  --    No results for input(s): LIPASE, AMYLASE in the last 8760 hours. No results for input(s): AMMONIA in the last 8760 hours. CBC: Recent Labs    10/04/20 0314 10/05/20 0331 02/08/21 0810  WBC 10.7* 13.0* 6.3  NEUTROABS  --   --  3,692  HGB 9.7* 10.5* 13.5  HCT 29.9* 32.4* 41.6  MCV 101.0* 101.3* 95.0  PLT 160 169 262   Lipid Panel: Recent Labs    02/08/21 0810  CHOL 238*  HDL 102  LDLCALC 115*  TRIG 104  CHOLHDL 2.3   No results found for: HGBA1C  Procedures since last visit: No results found.  Assessment/Plan 1. Bilateral upper abdominal discomfort/tightening ? Etiology. Go to ED if symptoms recur  Now resolved Covid test was negative EKG did not show any acute changes  - CBC with Differential/Platelet; Future - COMPLETE METABOLIC PANEL WITH GFR; Future - Urinalysis; Future - Lipase; Future  2. Dark urine most likely as she is not drinking enough Check Urine   - CBC with Differential/Platelet; Future - COMPLETE METABOLIC PANEL WITH GFR;  Future - Urinalysis; Future  3. Poor appetite Says it is getting better  - CBC with Differential/Platelet; Future - COMPLETE METABOLIC PANEL WITH GFR; Future - Urinalysis; Future    Labs/tests ordered:  * No order type specified * Next appt:  08/09/2021

## 2021-04-18 NOTE — Telephone Encounter (Signed)
Spoke with patient. Patient stated she is glad that she will be able to see Dr.Gupta today. Patient aware to call 911 if symptoms return and persist.

## 2021-04-19 ENCOUNTER — Other Ambulatory Visit: Payer: Self-pay

## 2021-04-19 DIAGNOSIS — R1011 Right upper quadrant pain: Secondary | ICD-10-CM

## 2021-04-19 DIAGNOSIS — R630 Anorexia: Secondary | ICD-10-CM

## 2021-04-19 DIAGNOSIS — R82998 Other abnormal findings in urine: Secondary | ICD-10-CM

## 2021-04-19 LAB — CBC WITH DIFFERENTIAL/PLATELET
Absolute Monocytes: 617 cells/uL (ref 200–950)
Basophils Absolute: 63 cells/uL (ref 0–200)
Basophils Relative: 1 %
Eosinophils Absolute: 151 cells/uL (ref 15–500)
Eosinophils Relative: 2.4 %
HCT: 39.9 % (ref 35.0–45.0)
Hemoglobin: 13.1 g/dL (ref 11.7–15.5)
Lymphs Abs: 1512 cells/uL (ref 850–3900)
MCH: 32.2 pg (ref 27.0–33.0)
MCHC: 32.8 g/dL (ref 32.0–36.0)
MCV: 98 fL (ref 80.0–100.0)
MPV: 9.9 fL (ref 7.5–12.5)
Monocytes Relative: 9.8 %
Neutro Abs: 3956 cells/uL (ref 1500–7800)
Neutrophils Relative %: 62.8 %
Platelets: 314 10*3/uL (ref 140–400)
RBC: 4.07 10*6/uL (ref 3.80–5.10)
RDW: 12 % (ref 11.0–15.0)
Total Lymphocyte: 24 %
WBC: 6.3 10*3/uL (ref 3.8–10.8)

## 2021-04-19 LAB — COMPLETE METABOLIC PANEL WITH GFR
AG Ratio: 1.4 (calc) (ref 1.0–2.5)
ALT: 13 U/L (ref 6–29)
AST: 19 U/L (ref 10–35)
Albumin: 3.9 g/dL (ref 3.6–5.1)
Alkaline phosphatase (APISO): 48 U/L (ref 37–153)
BUN: 17 mg/dL (ref 7–25)
CO2: 30 mmol/L (ref 20–32)
Calcium: 9.3 mg/dL (ref 8.6–10.4)
Chloride: 103 mmol/L (ref 98–110)
Creat: 0.76 mg/dL (ref 0.60–0.95)
Globulin: 2.8 g/dL (calc) (ref 1.9–3.7)
Glucose, Bld: 94 mg/dL (ref 65–99)
Potassium: 4 mmol/L (ref 3.5–5.3)
Sodium: 139 mmol/L (ref 135–146)
Total Bilirubin: 0.6 mg/dL (ref 0.2–1.2)
Total Protein: 6.7 g/dL (ref 6.1–8.1)
eGFR: 78 mL/min/{1.73_m2} (ref >=60)

## 2021-04-19 LAB — LIPASE: Lipase: 54 U/L (ref 7–60)

## 2021-04-20 LAB — URINALYSIS
Bilirubin Urine: NEGATIVE
Glucose, UA: NEGATIVE
Hgb urine dipstick: NEGATIVE
Ketones, ur: NEGATIVE
Leukocytes,Ua: NEGATIVE
Nitrite: NEGATIVE
Protein, ur: NEGATIVE
Specific Gravity, Urine: 1.019 (ref 1.001–1.035)
pH: 5.5 (ref 5.0–8.0)

## 2021-04-20 LAB — EXTRA URINE SPECIMEN

## 2021-05-02 ENCOUNTER — Ambulatory Visit: Payer: Medicare PPO | Admitting: Internal Medicine

## 2021-05-03 DIAGNOSIS — L821 Other seborrheic keratosis: Secondary | ICD-10-CM | POA: Diagnosis not present

## 2021-05-03 DIAGNOSIS — L570 Actinic keratosis: Secondary | ICD-10-CM | POA: Diagnosis not present

## 2021-05-03 DIAGNOSIS — L814 Other melanin hyperpigmentation: Secondary | ICD-10-CM | POA: Diagnosis not present

## 2021-05-03 DIAGNOSIS — D1801 Hemangioma of skin and subcutaneous tissue: Secondary | ICD-10-CM | POA: Diagnosis not present

## 2021-05-24 ENCOUNTER — Telehealth: Payer: Self-pay

## 2021-05-24 NOTE — Telephone Encounter (Signed)
Patient denied scheduling an appointment for a Medicare AWV in clinic with Windell Moulding, NP.  ?

## 2021-08-14 ENCOUNTER — Encounter: Payer: Self-pay | Admitting: Internal Medicine

## 2021-08-15 ENCOUNTER — Encounter: Payer: Self-pay | Admitting: Internal Medicine

## 2021-08-15 ENCOUNTER — Non-Acute Institutional Stay: Payer: Medicare PPO | Admitting: Internal Medicine

## 2021-08-15 VITALS — BP 126/77 | HR 82 | Temp 97.7°F | Ht 67.0 in | Wt 125.9 lb

## 2021-08-15 DIAGNOSIS — L989 Disorder of the skin and subcutaneous tissue, unspecified: Secondary | ICD-10-CM | POA: Diagnosis not present

## 2021-08-15 DIAGNOSIS — G3184 Mild cognitive impairment, so stated: Secondary | ICD-10-CM

## 2021-08-15 DIAGNOSIS — H00012 Hordeolum externum right lower eyelid: Secondary | ICD-10-CM

## 2021-08-15 DIAGNOSIS — Z96641 Presence of right artificial hip joint: Secondary | ICD-10-CM

## 2021-08-15 NOTE — Progress Notes (Signed)
Location:  [facility] (12)  Provider:   Code Status:  Goals of Care:     08/14/2021    3:42 PM  Advanced Directives  Does Patient Have a Medical Advance Directive? Yes  Type of Advance Directive [place]  Does patient want to make changes to medical advance directive? No - Patient declined  Copy of [place] in Chart? Yes - validated most recent copy scanned in chart (See row information)     Chief Complaint  Patient presents with   Medical Management of Chronic Issues    Patient returns to the clinic for her 6 month follow up. She would like to discuss the spots throughout her body, Right eye redness and discuss labs.    Quality Metric Gaps    Verified [facility] patient is due for: Zoster Vaccines- Shingrix (1 of 2)   DEXA SCAN (Once) TETANUS/TDAP (Every 10 Years) Pneumonia Vaccine 16+ Years old      HPI: Patient is a 81 y.o. female seen today for medical management of chronic diseases.    Patient has h/o Mild Cognitive impairment [facility] well in [facility] Walks with no assist now No Falls. Son and daughter are out of town   S/p Right Hip arthroplasty in 10/03/2020 Doing well No Pian or discomfort   Has h/o Rib Fracture after a fall in 05/22   Has stopped driving now.  Son is taking care of her finances  Continues to notice worsening Cognition but does not want anything for it or See Neurology She has lesion on her Forehead which seems she has seen Dermatology but she is not sure Also forgot about her vaccinations and Bone density scan She had Stye in her Right eye   Past Medical History:  Diagnosis Date   Arthritis     Past Surgical History:  Procedure Laterality Date   TOTAL HIP ARTHROPLASTY Right 10/03/2020   Procedure: [place];  Surgeon: Paralee Cancel, MD;  [facility]: [facility];  Service: Orthopedics;  Laterality: Right;    Allergies   Allergen Reactions   Hydrocodone     Patient got very confused with Hallucinations    Outpatient Encounter Medications as of 08/15/2021  Medication Sig   acetaminophen (TYLENOL) 500 MG tablet Take 1,000 mg by mouth every 8 (eight) hours as needed for mild pain.   Multiple Vitamins-Minerals (MULTIVITAMIN WITH MINERALS) tablet Take 1 tablet by mouth daily.   Polyethyl Glycol-Propyl Glycol (SYSTANE) 0.4-0.3 % SOLN Apply to eye.   polyethylene glycol (MIRALAX / GLYCOLAX) 17 g packet Take 17 g by mouth daily as needed for mild constipation.   No facility-administered encounter medications on file as of 08/15/2021.    Review of Systems:  Review of Systems  Constitutional:  Negative for activity change and appetite change.  HENT: Negative.    Respiratory:  Negative for cough and shortness of breath.   Cardiovascular:  Negative for leg swelling.  Gastrointestinal:  Negative for constipation.  Genitourinary: Negative.   Musculoskeletal:  Negative for arthralgias, gait problem and myalgias.  Skin: Negative.   Neurological:  Negative for dizziness and weakness.  Psychiatric/Behavioral:  Positive for confusion. Negative for dysphoric mood and sleep disturbance.     Health Maintenance  Topic Date Due   Zoster Vaccines- Shingrix (1 of 2) Never done   DEXA SCAN  Never done   TETANUS/TDAP  03/04/2017   Pneumonia Vaccine 64+ Years old (  2 - PPSV23 if available, else PCV20) 08/03/2021   INFLUENZA VACCINE  10/02/2021   COVID-19 Vaccine (5 - Moderna series) 12/15/2021   HPV VACCINES  Aged Out    Physical Exam: Vitals:   08/15/21 1457  BP: 126/77  Pulse: 82  Temp: 97.7 F (36.5 C)  SpO2: 98%  Weight: 125 lb 14.4 oz (57.1 kg)  Height: '5\' 7"'$  (1.702 m)   Body mass index is 19.72 kg/m. Physical Exam Vitals reviewed.  Constitutional:      Appearance: Normal appearance.  HENT:     Head: Normocephalic.     Nose: Nose normal.     Mouth/Throat:     Mouth: Mucous membranes are moist.      Pharynx: Oropharynx is clear.  Eyes:     Pupils: Pupils are equal, round, and reactive to light.  Cardiovascular:     Rate and Rhythm: Normal rate and regular rhythm.     Pulses: Normal pulses.     Heart sounds: Normal heart sounds. No murmur heard. Pulmonary:     Effort: Pulmonary effort is normal.     Breath sounds: Normal breath sounds.  Abdominal:     General: Abdomen is flat. Bowel sounds are normal.     Palpations: Abdomen is soft.  Musculoskeletal:        General: No swelling.     Cervical back: Neck supple.  Skin:    General: Skin is warm.  Neurological:     General: No focal deficit present.     Mental Status: She is alert and oriented to person, place, and time.  Psychiatric:        Mood and Affect: Mood normal.        Thought Content: Thought content normal.       08/15/2021    3:18 PM  MMSE - Mini Mental State Exam  Orientation to time 4  Orientation to Place 5  Registration 3  Attention/ Calculation 1  Recall 1  Language- name 2 objects 2  Language- repeat 1  Language- follow 3 step command 1  Language- read & follow direction 1  Write a sentence 1  Copy design 1  Total score 21     Labs reviewed: Basic Metabolic Panel: Recent Labs    10/04/20 0314 02/08/21 0810 04/19/21 0820  NA 138 141 139  K 4.2 4.1 4.0  CL 106 103 103  CO2 '24 27 30  '$ GLUCOSE 166* 85 94  BUN '12 12 17  '$ CREATININE 0.76 0.71 0.76  CALCIUM 8.4* 9.7 9.3  TSH  --  2.28  --    Liver Function Tests: Recent Labs    09/29/20 0950 02/08/21 0810 04/19/21 0820  AST '21 16 19  '$ ALT '17 11 13  '$ ALKPHOS 49  --   --   BILITOT 0.8 0.9 0.6  PROT 7.1 7.1 6.7  ALBUMIN 4.2  --   --    Recent Labs    04/19/21 0820  LIPASE 54   No results for input(s): "AMMONIA" in the last 8760 hours. CBC: Recent Labs    10/05/20 0331 02/08/21 0810 04/19/21 0820  WBC 13.0* 6.3 6.3  NEUTROABS  --  3,692 3,956  HGB 10.5* 13.5 13.1  HCT 32.4* 41.6 39.9  MCV 101.3* 95.0 98.0  PLT 169 262 314    Lipid Panel: Recent Labs    02/08/21 0810  CHOL 238*  HDL 102  LDLCALC 115*  TRIG 104  CHOLHDL 2.3   No results found for: "  HGBA1C"  Procedures since last visit: No results found.  Assessment/Plan 1. Mild cognitive impairment 21/30 Passed her Clock drawing Does not want any meds or see Neuro  2. Hordeolum externum of right lower eyelid Hot compresses Instruction given  3. Status post total replacement of right hip Doing well  4. Skin lesion on Forehead She will  call Dermatology for follow up  Reminders for Bone Density and Vaccinations update    Labs/tests ordered:  * No order type specified * Next appt:  Visit date not found  Total time spent in this patient care encounter was  45_  minutes; greater than 50% of the visit spent counseling patient and staff, reviewing records , Labs and coordinating care for problems addressed at this encounter.

## 2021-08-15 NOTE — Patient Instructions (Addendum)
You need to get Pneumonia shot PCV 20 and Tetanus shot I want you to call dermatology and make appointment You need to also get Bone density scan done from [facility] in [place]. Call them before going for the test

## 2021-09-19 ENCOUNTER — Encounter: Payer: Self-pay | Admitting: Internal Medicine

## 2021-09-19 ENCOUNTER — Non-Acute Institutional Stay: Payer: Medicare PPO | Admitting: Internal Medicine

## 2021-09-19 VITALS — BP 143/74 | HR 83 | Temp 97.7°F | Ht 67.0 in | Wt 127.5 lb

## 2021-09-19 DIAGNOSIS — G3184 Mild cognitive impairment, so stated: Secondary | ICD-10-CM

## 2021-09-19 DIAGNOSIS — U071 COVID-19: Secondary | ICD-10-CM

## 2021-09-19 NOTE — Patient Instructions (Signed)
Stay in your apartment until Tuesday and call our office if you develop cough or shortness of breath.

## 2021-09-19 NOTE — Progress Notes (Signed)
Location: [facility] (12)  Provider:   Code Status:  Goals of Care:     09/19/2021    2:37 PM  Advanced Directives  Does Patient Have a Medical Advance Directive? Yes  Type of Advance Directive [place]  Does patient want to make changes to medical advance directive? No - Patient declined  Copy of [place] in Chart? Yes - validated most recent copy scanned in chart (See row information)     Chief Complaint  Patient presents with   Acute Visit    Patient returns to the clinic covid positive.    HPI: Patient is a 81 y.o. 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   But it seems like she had her daughter and other people in her apartment over the weekend.   On Monday she states she felt confused.  Tired.  Overwhelmed.   Still thought that the daughter is there when she has already left.    She came to the facility nurse and told her to check her for Britton.  I am not sure if she was exposed to somebody with COVID. The nurses checked her and she was COVID-positive. And she was told to come and see me today in the clinic  Patient denies any cough shortness of breath fever chest pain. She states that she feels better and has not been having any more symptoms of confusion. Patient does have mild cognitive impairment on baseline   Past Medical History:  Diagnosis Date   Arthritis     Past Surgical History:  Procedure Laterality Date   TOTAL HIP ARTHROPLASTY Right 10/03/2020   Procedure: TOTAL HIP ARTHROPLASTY ANTERIOR APPROACH;  Surgeon: Paralee Cancel, MD;  Location: [facility];  Service: Orthopedics;  Laterality: Right;    Allergies  Allergen Reactions   Hydrocodone     Patient got very confused with Hallucinations    Outpatient  Encounter Medications as of 09/19/2021  Medication Sig   acetaminophen (TYLENOL) 500 MG tablet Take 1,000 mg by mouth every 8 (eight) hours as needed for mild pain.   Multiple Vitamins-Minerals (MULTIVITAMIN WITH MINERALS) tablet Take 1 tablet by mouth daily.   Polyethyl Glycol-Propyl Glycol (SYSTANE) 0.4-0.3 % SOLN Apply to eye.   polyethylene glycol (MIRALAX / GLYCOLAX) 17 g packet Take 17 g by mouth daily as needed for mild constipation.   No facility-administered encounter medications on file as of 09/19/2021.    Review of Systems:  Review of Systems  Constitutional:  Negative for activity change and appetite change.  HENT: Negative.    Respiratory:  Negative for cough and shortness of breath.   Cardiovascular:  Negative for leg swelling.  Gastrointestinal:  Negative for constipation.  Genitourinary: Negative.   Musculoskeletal:  Negative for arthralgias, gait problem and myalgias.  Skin: Negative.   Neurological:  Positive for weakness. Negative for dizziness.  Psychiatric/Behavioral:  Positive for confusion. Negative for dysphoric mood and sleep disturbance.     Health Maintenance  Topic Date Due   Zoster Vaccines- Shingrix (1 of 2) Never done   DEXA SCAN  Never done   TETANUS/TDAP  03/04/2017   Pneumonia Vaccine 76+ Years old (2 - PPSV23 or PCV20) 08/03/2021   INFLUENZA VACCINE  10/02/2021   COVID-19 Vaccine (5 - Moderna  series) 12/15/2021   HPV VACCINES  Aged Out    Physical Exam: Vitals:   09/19/21 1435  BP: (!) 143/74  Pulse: 83  Temp: 97.7 F (36.5 C)  SpO2: 99%  Weight: 127 lb 8 oz (57.8 kg)  Height: '5\' 7"'$  (1.702 m)   Body mass index is 19.97 kg/m. Physical Exam Vitals reviewed.  Constitutional:      Appearance: Normal appearance.  HENT:     Head: Normocephalic.     Nose: Nose normal.     Mouth/Throat:     Mouth: Mucous membranes are moist.     Pharynx: Oropharynx is clear.  Eyes:     Pupils: Pupils are equal, round, and reactive to light.   Cardiovascular:     Rate and Rhythm: Normal rate and regular rhythm.     Pulses: Normal pulses.     Heart sounds: Normal heart sounds. No murmur heard. Pulmonary:     Effort: Pulmonary effort is normal.     Breath sounds: Normal breath sounds.  Abdominal:     General: Abdomen is flat. Bowel sounds are normal.     Palpations: Abdomen is soft.  Musculoskeletal:        General: No swelling.     Cervical back: Neck supple.  Skin:    General: Skin is warm.  Neurological:     General: No focal deficit present.     Mental Status: She is alert and oriented to person, place, and time.  Psychiatric:        Mood and Affect: Mood normal.        Thought Content: Thought content normal.     Labs reviewed: Basic Metabolic Panel: Recent Labs    10/04/20 0314 02/08/21 0810 04/19/21 0820  NA 138 141 139  K 4.2 4.1 4.0  CL 106 103 103  CO2 '24 27 30  '$ GLUCOSE 166* 85 94  BUN '12 12 17  '$ CREATININE 0.76 0.71 0.76  CALCIUM 8.4* 9.7 9.3  TSH  --  2.28  --    Liver Function Tests: Recent Labs    09/29/20 0950 02/08/21 0810 04/19/21 0820  AST '21 16 19  '$ ALT '17 11 13  '$ ALKPHOS 49  --   --   BILITOT 0.8 0.9 0.6  PROT 7.1 7.1 6.7  ALBUMIN 4.2  --   --    Recent Labs    04/19/21 0820  LIPASE 54   No results for input(s): "AMMONIA" in the last 8760 hours. CBC: Recent Labs    10/05/20 0331 02/08/21 0810 04/19/21 0820  WBC 13.0* 6.3 6.3  NEUTROABS  --  3,692 3,956  HGB 10.5* 13.5 13.1  HCT 32.4* 41.6 39.9  MCV 101.3* 95.0 98.0  PLT 169 262 314   Lipid Panel: Recent Labs    02/08/21 0810  CHOL 238*  HDL 102  LDLCALC 115*  TRIG 104  CHOLHDL 2.3   No results found for: "HGBA1C"  Procedures since last visit: No results found.  Assessment/Plan 1. COVID-19 virus infection Patient is completely asymptomatic. Was not started on Paxlovid She can stay in her apartment till Tuesday of next week Discussed with the facility nurse. She will get her food delivered from  friends home.  2. Mild cognitive impairment Mmse 21/30    Labs/tests ordered:  * No order type specified * Next appt:  12/06/2021

## 2021-12-06 ENCOUNTER — Telehealth: Payer: Self-pay | Admitting: *Deleted

## 2021-12-06 ENCOUNTER — Other Ambulatory Visit: Payer: Self-pay | Admitting: *Deleted

## 2021-12-06 ENCOUNTER — Other Ambulatory Visit: Payer: Self-pay | Admitting: Orthopedic Surgery

## 2021-12-06 DIAGNOSIS — G3184 Mild cognitive impairment, so stated: Secondary | ICD-10-CM

## 2021-12-06 NOTE — Telephone Encounter (Signed)
Labs Released. Appointment scheduled for Monday and Tammy notified, she will notify patient.

## 2021-12-06 NOTE — Telephone Encounter (Signed)
Orders for labs placed 

## 2021-12-06 NOTE — Telephone Encounter (Signed)
Tammy with [facility] called and stated that patient had an appointment today for [facility] but no orders were in the system.   Patient is scheduled for labs but no orders were placed. I reviewed last 2 OV notes but nothing is mentioned about labs ordered.   Tammy stated that if labs can be placed patient could come back on Monday to have drawn before her appointment on Wednesday.   Please Advise. (Forwarded to Amy due to Dr. Lyndel Safe out of office.)

## 2021-12-10 ENCOUNTER — Other Ambulatory Visit: Payer: Medicare PPO

## 2021-12-12 ENCOUNTER — Encounter: Payer: Medicare PPO | Admitting: Internal Medicine

## 2021-12-14 NOTE — Progress Notes (Signed)
A user error has taken place.

## 2021-12-31 ENCOUNTER — Encounter: Payer: Self-pay | Admitting: Orthopedic Surgery

## 2021-12-31 ENCOUNTER — Ambulatory Visit: Payer: Medicare PPO | Admitting: Orthopedic Surgery

## 2021-12-31 VITALS — BP 138/84 | HR 96 | Temp 97.5°F | Resp 18 | Ht 69.0 in | Wt 127.1 lb

## 2021-12-31 DIAGNOSIS — S0012XA Contusion of left eyelid and periocular area, initial encounter: Secondary | ICD-10-CM

## 2021-12-31 DIAGNOSIS — R2681 Unsteadiness on feet: Secondary | ICD-10-CM | POA: Diagnosis not present

## 2021-12-31 DIAGNOSIS — S0003XA Contusion of scalp, initial encounter: Secondary | ICD-10-CM | POA: Diagnosis not present

## 2021-12-31 NOTE — Patient Instructions (Addendum)
Please contact provider if you have headaches that are not going away, nausea or vomiting  May apply ice to left eye/face to help with pain and swelling  May take tylenol for headache or minor pain

## 2021-12-31 NOTE — Progress Notes (Addendum)
Careteam: Patient Care Team: Virgie Dad, MD as PCP - General (Internal Medicine)  Seen by: Windell Moulding, AGNP-C  PLACE OF SERVICE: [facility] Advanced Directive information    Allergies  Allergen Reactions   Hydrocodone     Patient got very confused with Hallucinations    Chief Complaint  Patient presents with   Acute Visit    F/u head injury     HPI: Patient is a 81 y.o. female seen today for acute visit due to head injury.   She currently resides in [place] at [facility]. PMH: mild cognitive impairment, blood loss anemia, OA, h/o fall with rib fracture 07/2020, h/o fall with hip fracture s/p LATH 10/2020.   10/27 she was trying to turn her lamp on, and it fell on her head. She reports lamp hitting the back of her head and left side of her face. Her glasses broke during incident and cut side of her face. She denies losing consciousness. She was evaluated by [place] nurse at [facility], advised to see provider 10/30. She denies headaches, nausea or vomiting. Reports mild tenderness to back of head and left side of face. She is not on a blood thinner. Afebrile. Vitals stable.   Review of Systems:  Review of Systems  Constitutional:  Negative for fever, malaise/fatigue and weight loss.  HENT:  Negative for congestion and sore throat.   Eyes:  Negative for blurred vision and double vision.  Respiratory:  Negative for cough, shortness of breath and wheezing.   Cardiovascular:  Negative for chest pain and leg swelling.  Gastrointestinal:  Negative for blood in stool and constipation.  Genitourinary:  Negative for dysuria and frequency.  Musculoskeletal:  Positive for falls. Negative for joint pain and myalgias.  Neurological:  Negative for dizziness, weakness and headaches.  Psychiatric/Behavioral:  Positive for memory loss. Negative for depression. The patient is not nervous/anxious.     Past Medical History:  Diagnosis Date   Arthritis    Past  Surgical History:  Procedure Laterality Date   TOTAL HIP ARTHROPLASTY Right 10/03/2020   Procedure: TOTAL HIP ARTHROPLASTY ANTERIOR APPROACH;  Surgeon: Paralee Cancel, MD;  Location: [facility];  Service: Orthopedics;  Laterality: Right;   Social History:   reports that she has quit smoking. She has never used smokeless tobacco. She reports current alcohol use. She reports that she does not use drugs.  Family History  Problem Relation Age of Onset   Healthy Mother    Parkinson's disease Mother    Healthy Father    Cancer Father     Medications: Patient's Medications  New Prescriptions   No medications on file  Previous Medications   ACETAMINOPHEN (TYLENOL) 500 MG TABLET    Take 1,000 mg by mouth every 8 (eight) hours as needed for mild pain.   MULTIPLE VITAMINS-MINERALS (MULTIVITAMIN WITH MINERALS) TABLET    Take 1 tablet by mouth daily.   POLYETHYL GLYCOL-PROPYL GLYCOL (SYSTANE) 0.4-0.3 % SOLN    Apply to eye.   POLYETHYLENE GLYCOL (MIRALAX / GLYCOLAX) 17 G PACKET    Take 17 g by mouth daily as needed for mild constipation.  Modified Medications   No medications on file  Discontinued Medications   No medications on file    Physical Exam:  Vitals:   12/31/21 1253  BP: 138/84  Pulse: 96  Resp: 18  Temp: (!) 97.5 F (36.4 C)  SpO2: 96%  Weight: 127 lb 1.6 oz (57.7 kg)  Height: '5\' 9"'$  (1.753 m)   Body mass index is 18.77 kg/m. Wt Readings from Last 3 Encounters:  12/31/21 127 lb 1.6 oz (57.7 kg)  09/19/21 127 lb 8 oz (57.8 kg)  08/15/21 125 lb 14.4 oz (57.1 kg)    Physical Exam Vitals reviewed.  Constitutional:      General: She is not in acute distress. HENT:     Head: Normocephalic. Contusion and left periorbital erythema present. No raccoon eyes, Battle's sign, abrasion or right periorbital erythema. Hair is normal.     Comments: Mild bruising to parietal lobe, no skin breakdown/swelling, some tenderness. Mild left periorbital edema, left brow-line with yellowish  green bruising. Approx 1 cm linear cut to left temple, scab intact, no drainage, surrounding skin intact.  Eyes:     General: Lids are normal.        Right eye: No discharge.        Left eye: No discharge.     Extraocular Movements: Extraocular movements intact.     Right eye: Normal extraocular motion and no nystagmus.     Left eye: Normal extraocular motion and no nystagmus.     Conjunctiva/sclera: Conjunctivae normal.     Pupils: Pupils are equal, round, and reactive to light.  Cardiovascular:     Rate and Rhythm: Normal rate and regular rhythm.     Pulses: Normal pulses.     Heart sounds: Normal heart sounds.  Pulmonary:     Effort: Pulmonary effort is normal. No respiratory distress.     Breath sounds: Normal breath sounds. No wheezing.  Abdominal:     General: Bowel sounds are normal. There is no distension.     Palpations: Abdomen is soft.     Tenderness: There is no abdominal tenderness.  Musculoskeletal:     Cervical back: Neck supple.     Right lower leg: No edema.     Left lower leg: No edema.  Skin:    General: Skin is warm and dry.     Capillary Refill: Capillary refill takes less than 2 seconds.  Neurological:     General: No focal deficit present.     Mental Status: She is alert. Mental status is at baseline.     Motor: Weakness present.     Gait: Gait abnormal.     Comments: cane  Psychiatric:        Mood and Affect: Mood normal.        Behavior: Behavior normal.     Comments: Very pleasant, forgetful, follows commands, alert to self/person/situation     Labs reviewed: Basic Metabolic Panel: Recent Labs    02/08/21 0810 04/19/21 0820  NA 141 139  K 4.1 4.0  CL 103 103  CO2 27 30  GLUCOSE 85 94  BUN 12 17  CREATININE 0.71 0.76  CALCIUM 9.7 9.3  TSH 2.28  --    Liver Function Tests: Recent Labs    02/08/21 0810 04/19/21 0820  AST 16 19  ALT 11 13  BILITOT 0.9 0.6  PROT 7.1 6.7   Recent Labs    04/19/21 0820  LIPASE 54   No results  for input(s): "AMMONIA" in the last 8760 hours. CBC: Recent Labs    02/08/21 0810 04/19/21 0820  WBC 6.3 6.3  NEUTROABS 3,692 3,956  HGB 13.5 13.1  HCT 41.6 39.9  MCV 95.0 98.0  PLT 262 314   Lipid Panel: Recent Labs    02/08/21 0810  CHOL 238*  HDL 102  LDLCALC 115*  TRIG 104  CHOLHDL 2.3   TSH: Recent Labs    02/08/21 0810  TSH 2.28   A1C: No results found for: "HGBA1C"   Assessment/Plan 1. Contusion of scalp, initial encounter - 10/27 lamp fell on her head - bruising to parietal lobe, left periorbital/brow-line - exam unremarkable - not on blood thinner - advised to contact PCP if headaches/nausea/vomiting/malaise occur  2. Contusion of left eyelid, initial encounter - see above - may use tylenol prn for pain - may use ice prn for pain, 20 minutes increments  3. Unstable gait - forgot cane today  - grabbing furniture and railing to exam room today - PT/OT evaluation   Total time: 21 minutes. Greater than 50% of total time spent doing patient education regarding head and eye injury, including when to contact PCP/symptom and medication management.     Next appt: Visit date not found  [place], [facility] [phone]

## 2022-01-14 DIAGNOSIS — H2513 Age-related nuclear cataract, bilateral: Secondary | ICD-10-CM | POA: Diagnosis not present

## 2022-03-07 DIAGNOSIS — L570 Actinic keratosis: Secondary | ICD-10-CM | POA: Diagnosis not present

## 2022-03-07 DIAGNOSIS — L821 Other seborrheic keratosis: Secondary | ICD-10-CM | POA: Diagnosis not present

## 2022-03-07 DIAGNOSIS — L814 Other melanin hyperpigmentation: Secondary | ICD-10-CM | POA: Diagnosis not present

## 2022-03-07 DIAGNOSIS — L820 Inflamed seborrheic keratosis: Secondary | ICD-10-CM | POA: Diagnosis not present

## 2022-04-15 ENCOUNTER — Ambulatory Visit: Payer: Medicare PPO | Admitting: Orthopedic Surgery

## 2022-04-15 ENCOUNTER — Encounter: Payer: Self-pay | Admitting: Orthopedic Surgery

## 2022-04-15 VITALS — BP 122/78 | HR 79 | Temp 96.6°F | Resp 18 | Ht 69.0 in | Wt 127.4 lb

## 2022-04-15 DIAGNOSIS — R296 Repeated falls: Secondary | ICD-10-CM | POA: Diagnosis not present

## 2022-04-15 DIAGNOSIS — R42 Dizziness and giddiness: Secondary | ICD-10-CM | POA: Diagnosis not present

## 2022-04-15 DIAGNOSIS — S0012XA Contusion of left eyelid and periocular area, initial encounter: Secondary | ICD-10-CM

## 2022-04-15 NOTE — Patient Instructions (Signed)
Limit caffeine intake  Hydrate more with water> limit fluids in evening to limit bathroom trip at night  Purchase blood pressure cuff for home- take when you do not feel well and record readings  Will order PT/OT evaluation

## 2022-04-15 NOTE — Progress Notes (Signed)
Careteam: Patient Care Team: Virgie Dad, MD as PCP - General (Internal Medicine)  Seen by: Windell Moulding, AGNP-C  PLACE OF SERVICE:  [place] Directive information    Allergies  Allergen Reactions   Hydrocodone     Patient got very confused with Hallucinations    Chief Complaint  Patient presents with   Fall    Patient states that she had a fall last Saturday and think that she had falling in the bathroom. Also patient states that she has bruise and had knots in the head has not taking any medication.     HPI: Patient is a 82 y.o. female seen today for acute visit due to frequent falls.   Son present during encounter.   02/10 she reports fall and hitting her head. She does not remember incident, but believes it occurred at night. She has increased bruising to left eye and left upper arm. H/o scalp and left eye contusion 12/31/2021 due to fall (fell into lamp). She believes fall was related to dizziness. Unclear if she had been having multiple falls. She reports finishing PT/OT not too long ago. Ambulates with 4 point cane.   In an effort to prevent falls, she has stopped drinking wine in the evening. She is averaging about 2-3 trips to bathroom per night.      Review of Systems:  Review of Systems  Constitutional:  Negative for chills and fever.  HENT: Negative.    Eyes:  Negative for blurred vision, double vision, pain and discharge.  Respiratory:  Negative for cough, shortness of breath and wheezing.   Cardiovascular:  Negative for chest pain and leg swelling.  Gastrointestinal: Negative.   Genitourinary: Negative.   Musculoskeletal:  Positive for falls. Negative for joint pain.  Skin: Negative.   Neurological:  Positive for dizziness. Negative for tingling and weakness.  Psychiatric/Behavioral:  Positive for memory loss. Negative for depression. The patient is not nervous/anxious.     Past Medical History:  Diagnosis Date   Arthritis    Past  Surgical History:  Procedure Laterality Date   TOTAL HIP ARTHROPLASTY Right 10/03/2020   Procedure: TOTAL HIP ARTHROPLASTY ANTERIOR APPROACH;  Surgeon: Paralee Cancel, MD;  Location: [facility];  Service: Orthopedics;  Laterality: Right;   Social History:   reports that she has quit smoking. She has never used smokeless tobacco. She reports current alcohol use. She reports that she does not use drugs.  Family History  Problem Relation Age of Onset   Healthy Mother    Parkinson's disease Mother    Healthy Father    Cancer Father     Medications: Patient's Medications  New Prescriptions   No medications on file  Previous Medications   ACETAMINOPHEN (TYLENOL) 500 MG TABLET    Take 1,000 mg by mouth every 8 (eight) hours as needed for mild pain.   MULTIPLE VITAMINS-MINERALS (MULTIVITAMIN WITH MINERALS) TABLET    Take 1 tablet by mouth daily.   POLYETHYL GLYCOL-PROPYL GLYCOL (SYSTANE) 0.4-0.3 % SOLN    Apply to eye.   POLYETHYLENE GLYCOL (MIRALAX / GLYCOLAX) 17 G PACKET    Take 17 g by mouth daily as needed for mild constipation.  Modified Medications   No medications on file  Discontinued Medications   No medications on file    Physical Exam:  Vitals:   04/15/22 1033  BP: 122/78  Pulse: 79  Resp: 18  Temp: (!) 96.6 F (35.9 C)  SpO2: 98%  Weight: 127  lb 6 oz (57.8 kg)  Height: 5' 9"$  (1.753 m)   Body mass index is 18.81 kg/m. Wt Readings from Last 3 Encounters:  04/15/22 127 lb 6 oz (57.8 kg)  12/31/21 127 lb 1.6 oz (57.7 kg)  09/19/21 127 lb 8 oz (57.8 kg)    Physical Exam Vitals reviewed.  Constitutional:      General: She is not in acute distress. HENT:     Head: Normocephalic. Contusion present. No raccoon eyes, Battle's sign or abrasion.     Comments: Left brow line with increased bruising and swelling, tender to touch, no skin breakdown. Left peri orbital with increased swelling and bruising.   Eyes:     General:        Right eye: No discharge.        Left eye:  No discharge.  Neck:     Vascular: No carotid bruit.  Cardiovascular:     Rate and Rhythm: Normal rate and regular rhythm.     Pulses: Normal pulses.     Heart sounds: Normal heart sounds.  Pulmonary:     Effort: Pulmonary effort is normal. No respiratory distress.     Breath sounds: Normal breath sounds. No wheezing.  Abdominal:     General: Bowel sounds are normal. There is no distension.     Palpations: Abdomen is soft.     Tenderness: There is no abdominal tenderness.  Musculoskeletal:     Left upper arm: Tenderness present. No swelling, edema or deformity.     Cervical back: Neck supple.     Right lower leg: No edema.     Left lower leg: No edema.     Comments: FROM to LUE  Lymphadenopathy:     Cervical: No cervical adenopathy.  Skin:    General: Skin is warm and dry.     Capillary Refill: Capillary refill takes less than 2 seconds.     Comments: Bruising to left upper arm between elbow and shoulder, approx 3-5 cm, no skin breakdown  Neurological:     General: No focal deficit present.     Mental Status: She is alert. Mental status is at baseline.     Motor: Weakness present.     Gait: Gait abnormal.     Comments: 4 point cane  Psychiatric:        Mood and Affect: Mood normal.        Behavior: Behavior normal.     Labs reviewed: Basic Metabolic Panel: Recent Labs    04/19/21 0820  NA 139  K 4.0  CL 103  CO2 30  GLUCOSE 94  BUN 17  CREATININE 0.76  CALCIUM 9.3   Liver Function Tests: Recent Labs    04/19/21 0820  AST 19  ALT 13  BILITOT 0.6  PROT 6.7   Recent Labs    04/19/21 0820  LIPASE 54   No results for input(s): "AMMONIA" in the last 8760 hours. CBC: Recent Labs    04/19/21 0820  WBC 6.3  NEUTROABS 3,956  HGB 13.1  HCT 39.9  MCV 98.0  PLT 314   Lipid Panel: No results for input(s): "CHOL", "HDL", "LDLCALC", "TRIG", "CHOLHDL", "LDLDIRECT" in the last 8760 hours. TSH: No results for input(s): "TSH" in the last 8760  hours. A1C: No results found for: "HGBA1C"   Assessment/Plan 1. Dizziness - bp stable today - not on antihypertensive - will check blood counts and electrolytes - discussed checking blood pressure at home - advised to increase oral  hydration with water - consider orthostatics> will discuss with PT/OT - CBC with Differential/Platelet - Complete Metabolic Panel with eGFR  2. Frequent falls - 02/10 contusion to left eye and left upper arm  - PT/ OT evaluation> [place] nurse at [facility] given orders  3. Contusion of left eyelid, initial encounter - suspect due to mechanical fall 02/10 - h/o fall with facial injury/ left eye injury 0/2023 - exam unremarkable - see above  Total time: 32 minutes. Greater than 50% of total time spent doing patient education regarding dizziness, frequent falls, falls prevention and head injury including symptom/medication management.      Next appt: none Mellony Danziger [place], [facility] [phone]

## 2022-04-16 LAB — CBC WITH DIFFERENTIAL/PLATELET
Absolute Monocytes: 500 cells/uL (ref 200–950)
Basophils Absolute: 70 cells/uL (ref 0–200)
Basophils Relative: 1.4 %
Eosinophils Absolute: 80 cells/uL (ref 15–500)
Eosinophils Relative: 1.6 %
HCT: 39.8 % (ref 35.0–45.0)
Hemoglobin: 13.3 g/dL (ref 11.7–15.5)
Lymphs Abs: 1285 cells/uL (ref 850–3900)
MCH: 32.7 pg (ref 27.0–33.0)
MCHC: 33.4 g/dL (ref 32.0–36.0)
MCV: 97.8 fL (ref 80.0–100.0)
MPV: 10.4 fL (ref 7.5–12.5)
Monocytes Relative: 10 %
Neutro Abs: 3065 cells/uL (ref 1500–7800)
Neutrophils Relative %: 61.3 %
Platelets: 246 10*3/uL (ref 140–400)
RBC: 4.07 10*6/uL (ref 3.80–5.10)
RDW: 11.7 % (ref 11.0–15.0)
Total Lymphocyte: 25.7 %
WBC: 5 10*3/uL (ref 3.8–10.8)

## 2022-04-16 LAB — COMPLETE METABOLIC PANEL WITH GFR
AG Ratio: 1.3 (calc) (ref 1.0–2.5)
ALT: 14 U/L (ref 6–29)
AST: 21 U/L (ref 10–35)
Albumin: 4 g/dL (ref 3.6–5.1)
Alkaline phosphatase (APISO): 53 U/L (ref 37–153)
BUN: 14 mg/dL (ref 7–25)
CO2: 28 mmol/L (ref 20–32)
Calcium: 9.8 mg/dL (ref 8.6–10.4)
Chloride: 104 mmol/L (ref 98–110)
Creat: 0.8 mg/dL (ref 0.60–0.95)
Globulin: 3 g/dL (calc) (ref 1.9–3.7)
Glucose, Bld: 90 mg/dL (ref 65–99)
Potassium: 4.1 mmol/L (ref 3.5–5.3)
Sodium: 142 mmol/L (ref 135–146)
Total Bilirubin: 1 mg/dL (ref 0.2–1.2)
Total Protein: 7 g/dL (ref 6.1–8.1)
eGFR: 73 mL/min/{1.73_m2} (ref >=60)

## 2022-04-23 DIAGNOSIS — M6281 Muscle weakness (generalized): Secondary | ICD-10-CM | POA: Diagnosis not present

## 2022-04-23 DIAGNOSIS — Z9181 History of falling: Secondary | ICD-10-CM | POA: Diagnosis not present

## 2022-08-21 ENCOUNTER — Encounter: Payer: Self-pay | Admitting: Internal Medicine

## 2022-08-21 ENCOUNTER — Non-Acute Institutional Stay: Payer: Medicare PPO | Admitting: Internal Medicine

## 2022-08-21 VITALS — BP 122/70 | HR 93 | Temp 97.6°F | Resp 17 | Ht 69.0 in | Wt 119.8 lb

## 2022-08-21 DIAGNOSIS — E785 Hyperlipidemia, unspecified: Secondary | ICD-10-CM

## 2022-08-21 DIAGNOSIS — M79671 Pain in right foot: Secondary | ICD-10-CM

## 2022-08-21 DIAGNOSIS — R42 Dizziness and giddiness: Secondary | ICD-10-CM | POA: Diagnosis not present

## 2022-08-21 DIAGNOSIS — G3184 Mild cognitive impairment, so stated: Secondary | ICD-10-CM | POA: Diagnosis not present

## 2022-08-21 DIAGNOSIS — M79672 Pain in left foot: Secondary | ICD-10-CM | POA: Diagnosis not present

## 2022-08-21 NOTE — Progress Notes (Signed)
[facility] Service:  Clinic (12)  Provider:   Code Status:  Goals of Care:     08/21/2022    9:07 AM  Advanced Directives  Does Patient Have a Medical Advance Directive? Yes  Type of Advance Directive Healthcare Power of Attorney  Does patient want to make changes to medical advance directive? No - Patient declined  Copy of Healthcare Power of Attorney in Chart? Yes - validated most recent copy scanned in chart (See row information)     Chief Complaint  Patient presents with   Acute Visit    Patient states she os having some pain in both her pinky toes.   Immunizations    Patient due for updated covid, shingles, tdap and pneumonia     HPI: Patient is a 82 y.o. female seen today for an acute visit for Pain in Both Small toes For past few days she has noticed pain in her small toes bilateral.  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   Multiple Vitamins-Minerals (MULTIVITAMIN WITH MINERALS) tablet Take 1 tablet by mouth daily.   Polyethyl Glycol-Propyl Glycol (SYSTANE) 0.4-0.3 % SOLN Apply to eye.   polyethylene glycol (MIRALAX / GLYCOLAX) 17 g packet Take 17 g by mouth daily as needed for mild constipation.   No  facility-administered encounter medications on file as of 08/21/2022.    Review of Systems:  Review of Systems  Constitutional:  Negative for activity change and appetite change.  HENT: Negative.    Respiratory:  Negative for cough and shortness of breath.   Cardiovascular:  Negative for leg swelling.  Gastrointestinal:  Negative for constipation.  Genitourinary: Negative.   Musculoskeletal:  Negative for arthralgias, gait problem and myalgias.  Skin: Negative.   Neurological:  Negative for dizziness and weakness.  Psychiatric/Behavioral:  Negative for confusion, dysphoric mood and sleep disturbance.     Health Maintenance  Topic Date Due   Medicare Annual Wellness (AWV)  Never done   Zoster Vaccines- Shingrix (1 of 2) Never done   DEXA SCAN  Never done   DTaP/Tdap/Td (2 - Td or Tdap) 03/04/2017   Pneumonia Vaccine 39+ Years old (2 of 2 - PPSV23 or PCV20) 08/03/2021   COVID-19 Vaccine (9 - 2023-24 season) 11/02/2021   INFLUENZA VACCINE  10/03/2022   HPV VACCINES  Aged Out    Physical Exam: Vitals:   08/21/22 0904  BP: 122/70  Pulse: 93  Resp: 17  Temp: 97.6 F (36.4 C)  TempSrc: Temporal  SpO2: 99%  Weight: 119 lb 12.8 oz (54.3 kg)  Height: 5\' 9"  (1.753 m)   Body mass index is 17.69 kg/m. Physical Exam Vitals reviewed.  Constitutional:  Appearance: Normal appearance.  HENT:     Head: Normocephalic.     Nose: Nose normal.     Mouth/Throat:     Mouth: Mucous membranes are moist.     Pharynx: Oropharynx is clear.  Eyes:     Pupils: Pupils are equal, round, and reactive to light.  Cardiovascular:     Rate and Rhythm: Normal rate and regular rhythm.     Pulses: Normal pulses.     Heart sounds: Normal heart sounds. No murmur heard. Pulmonary:     Effort: Pulmonary effort is normal.     Breath sounds: Normal breath sounds.  Abdominal:     General: Abdomen is flat. Bowel sounds are normal.     Palpations: Abdomen is soft.  Musculoskeletal:        General:  No swelling.     Cervical back: Neck supple.     Comments: Bilateral Foot Exam No Redness no Swelling Not tender  DP Palpable   Skin:    General: Skin is warm.  Neurological:     General: No focal deficit present.     Mental Status: She is alert and oriented to person, place, and time.  Psychiatric:        Mood and Affect: Mood normal.        Thought Content: Thought content normal.     Labs reviewed: Basic Metabolic Panel: Recent Labs    04/15/22 1108  NA 142  K 4.1  CL 104  CO2 28  GLUCOSE 90  BUN 14  CREATININE 0.80  CALCIUM 9.8   Liver Function Tests: Recent Labs    04/15/22 1108  AST 21  ALT 14  BILITOT 1.0  PROT 7.0   No results for input(s): "LIPASE", "AMYLASE" in the last 8760 hours. No results for input(s): "AMMONIA" in the last 8760 hours. CBC: Recent Labs    04/15/22 1108  WBC 5.0  NEUTROABS 3,065  HGB 13.3  HCT 39.8  MCV 97.8  PLT 246   Lipid Panel: No results for input(s): "CHOL", "HDL", "LDLCALC", "TRIG", "CHOLHDL", "LDLDIRECT" in the last 8760 hours. No results found for: "HGBA1C"  Procedures since last visit: No results found.  Assessment/Plan 1. Pain in both feet/ Little toes No obvious signs of any reason for pain D/w her about getting Better water shoes Also we can do Xray but patient wants to wait She can take Tylenol PRN for now If pain does not get better she will let us know  2. Mild cognitive impairment Continues to manage in [place] Has not seen [place] for last I year Will schedule her appointment for follow up    Labs/tests ordered:  * No order type specified * Next appt:  10/23/2022

## 2022-08-21 NOTE — Patient Instructions (Signed)
Change your Water shoes  Can use tylenol PRN for pain

## 2022-09-23 DIAGNOSIS — R41841 Cognitive communication deficit: Secondary | ICD-10-CM | POA: Diagnosis not present

## 2022-09-24 DIAGNOSIS — R41841 Cognitive communication deficit: Secondary | ICD-10-CM | POA: Diagnosis not present

## 2022-09-25 DIAGNOSIS — R41841 Cognitive communication deficit: Secondary | ICD-10-CM | POA: Diagnosis not present

## 2022-09-26 DIAGNOSIS — R41841 Cognitive communication deficit: Secondary | ICD-10-CM | POA: Diagnosis not present

## 2022-09-30 DIAGNOSIS — R41841 Cognitive communication deficit: Secondary | ICD-10-CM | POA: Diagnosis not present

## 2022-10-03 DIAGNOSIS — R41841 Cognitive communication deficit: Secondary | ICD-10-CM | POA: Diagnosis not present

## 2022-10-07 DIAGNOSIS — R41841 Cognitive communication deficit: Secondary | ICD-10-CM | POA: Diagnosis not present

## 2022-10-15 ENCOUNTER — Other Ambulatory Visit: Payer: Medicare PPO

## 2022-10-23 ENCOUNTER — Encounter: Payer: Medicare PPO | Admitting: Internal Medicine

## 2022-10-31 DIAGNOSIS — L814 Other melanin hyperpigmentation: Secondary | ICD-10-CM | POA: Diagnosis not present

## 2022-10-31 DIAGNOSIS — L570 Actinic keratosis: Secondary | ICD-10-CM | POA: Diagnosis not present

## 2022-10-31 DIAGNOSIS — L821 Other seborrheic keratosis: Secondary | ICD-10-CM | POA: Diagnosis not present

## 2022-11-22 ENCOUNTER — Emergency Department (HOSPITAL_COMMUNITY)
Admission: EM | Admit: 2022-11-22 | Discharge: 2022-11-22 | Discharge status: Against Medical Advice | Payer: Medicare PPO | Attending: Emergency Medicine | Admitting: Emergency Medicine

## 2022-11-22 ENCOUNTER — Emergency Department (HOSPITAL_COMMUNITY): Payer: Medicare PPO

## 2022-11-22 ENCOUNTER — Other Ambulatory Visit: Payer: Self-pay

## 2022-11-22 DIAGNOSIS — Z5321 Procedure and treatment not carried out due to patient leaving prior to being seen by health care provider: Secondary | ICD-10-CM | POA: Diagnosis not present

## 2022-11-22 DIAGNOSIS — S0990XA Unspecified injury of head, initial encounter: Secondary | ICD-10-CM | POA: Diagnosis not present

## 2022-11-22 DIAGNOSIS — S199XXA Unspecified injury of neck, initial encounter: Secondary | ICD-10-CM | POA: Diagnosis not present

## 2022-11-22 DIAGNOSIS — S0083XA Contusion of other part of head, initial encounter: Secondary | ICD-10-CM | POA: Insufficient documentation

## 2022-11-22 DIAGNOSIS — W0110XA Fall on same level from slipping, tripping and stumbling with subsequent striking against unspecified object, initial encounter: Secondary | ICD-10-CM | POA: Insufficient documentation

## 2022-11-22 DIAGNOSIS — R9089 Other abnormal findings on diagnostic imaging of central nervous system: Secondary | ICD-10-CM | POA: Diagnosis not present

## 2022-11-22 NOTE — ED Triage Notes (Signed)
Pt arrived via POV with son from [facility]. C/o mech fall last night at 0200, hit head on ground. No LOC, not on blood thinners. Bruising and abrasion to L side of face. Pt reports no pain.  AOx4

## 2022-11-22 NOTE — ED Triage Notes (Signed)
Pt left at 2118 per registration

## 2022-11-25 ENCOUNTER — Encounter: Payer: Self-pay | Admitting: Orthopedic Surgery

## 2022-11-25 ENCOUNTER — Non-Acute Institutional Stay: Payer: Medicare PPO | Admitting: Orthopedic Surgery

## 2022-11-25 VITALS — BP 116/60 | HR 86 | Temp 97.6°F | Resp 16 | Ht 69.0 in | Wt 120.2 lb

## 2022-11-25 DIAGNOSIS — S0990XD Unspecified injury of head, subsequent encounter: Secondary | ICD-10-CM | POA: Diagnosis not present

## 2022-11-25 DIAGNOSIS — S0512XD Contusion of eyeball and orbital tissues, left eye, subsequent encounter: Secondary | ICD-10-CM

## 2022-11-25 DIAGNOSIS — W19XXXD Unspecified fall, subsequent encounter: Secondary | ICD-10-CM | POA: Diagnosis not present

## 2022-11-25 NOTE — Patient Instructions (Signed)
Follow up with Dr. Chales Abrahams

## 2022-11-25 NOTE — Progress Notes (Signed)
Location:  [facility]  [facility] (12) Provider:  Octavia Heir, NP   Mahlon Gammon, MD  Patient Care Team: Mahlon Gammon, MD as PCP - General (Internal Medicine)  Extended Emergency Contact Information Primary Emergency Contact: [facility] Address: [address]          [place], [place] [address] Darden [place] of [place] Home Phone: [phone] Work Phone: [phone] Mobile Phone: [phone] Relation: Son Preferred language: English Secondary Emergency Contact: Elsner,Elizabeth Home Phone: [phone] Mobile Phone: [phone] Relation: Daughter Preferred language: English Interpreter needed? No  Code Status:  Full code Goals of care: Advanced Directive information    11/25/2022    1:07 PM  Advanced Directives  Does Patient Have a Medical Advance Directive? Yes  Type of Estate agent of [place];Living will  Does patient want to make changes to medical advance directive? No - Patient declined  Copy of Healthcare Power of Attorney in Chart? Yes - validated most recent copy scanned in chart (See row information)     Chief Complaint  Patient presents with   Acute Visit    Patient complains of fall yesterday 11/24/2022.    HPI:  Pt is a 82 y.o. female seen today for acute visit due to fall.   09/20 she slipped on kitchen rug and hit her head against marble counter. She denies losing consciousness. She went to the ED after event. CT head/spine no acute intracranial abnormality, mild soft left frontal tissue swelling noted. She does have bruising and periorbital edema to left eye. She was discharged home same day. Not on anticoagulation. Denies dizziness, headaches or N/V. Denies changes to vision. She threw out old kitchen rug and plans to get new one with better grip. Falls safety in home discussed with patient.   No recent routine visit scheduled with Dr. Chales Abrahams will schedule appointment today.     Past  Medical History:  Diagnosis Date   Arthritis    Past Surgical History:  Procedure Laterality Date   TOTAL HIP ARTHROPLASTY Right 10/03/2020   Procedure: TOTAL HIP ARTHROPLASTY ANTERIOR APPROACH;  Surgeon: Durene Romans, MD;  Location: [facility];  Service: Orthopedics;  Laterality: Right;    Allergies  Allergen Reactions   Hydrocodone     Patient got very confused with Hallucinations    Outpatient Encounter Medications as of 11/25/2022  Medication Sig   acetaminophen (TYLENOL) 500 MG tablet Take 1,000 mg by mouth every 8 (eight) hours as needed for mild pain.   Multiple Vitamins-Minerals (MULTIVITAMIN WITH MINERALS) tablet Take 1 tablet by mouth daily.   Polyethyl Glycol-Propyl Glycol (SYSTANE) 0.4-0.3 % SOLN Apply to eye.   polyethylene glycol (MIRALAX / GLYCOLAX) 17 g packet Take 17 g by mouth daily as needed for mild constipation.   No facility-administered encounter medications on file as of 11/25/2022.    Review of Systems  Constitutional:  Negative for activity change and appetite change.  Eyes:  Negative for pain and visual disturbance.  Respiratory:  Negative for cough, shortness of breath and wheezing.   Cardiovascular:  Negative for chest pain and leg swelling.  Gastrointestinal:  Negative for abdominal distention and abdominal pain.  Musculoskeletal:  Negative for back pain, gait problem and neck pain.  Skin:  Positive for wound.  Neurological:  Negative for dizziness, weakness and light-headedness.  Psychiatric/Behavioral:  Negative for confusion and dysphoric mood. The patient is not nervous/anxious.     Immunization History  Administered Date(s) Administered  Influenza, High Dose Seasonal PF 11/24/2021   Influenza-Unspecified 12/03/2019, 12/07/2020   Moderna Covid Bivalent Peds Booster(34mo Thru 31yrs) 11/17/2020, 08/15/2021   Moderna SARS-COV2 Booster Vaccination 01/03/2020, 11/17/2020   Moderna Sars-Covid-2 Vaccination 04/17/2019, 05/15/2019, 01/03/2020, 08/03/2020    PPD Test 10/05/2020   Pneumococcal Conjugate-13 08/03/2020   Tdap 03/05/2007   Pertinent  Health Maintenance Due  Topic Date Due   DEXA SCAN  Never done   INFLUENZA VACCINE  10/03/2022      08/14/2021    3:40 PM 09/19/2021    2:37 PM 04/15/2022   10:35 AM 08/21/2022    9:07 AM 11/25/2022    1:06 PM  Fall Risk  Falls in the past year? 0 0 1 0 1  Was there an injury with Fall? 0 0 1 0 1  Fall Risk Category Calculator 0 0 3 0 2  Fall Risk Category (Retired) Low Low     (RETIRED) Patient Fall Risk Level Low fall risk Low fall risk     Patient at Risk for Falls Due to No Fall Risks No Fall Risks History of fall(s);Impaired mobility;Impaired balance/gait No Fall Risks Impaired balance/gait  Fall risk Follow up Falls evaluation completed Falls evaluation completed Falls evaluation completed Falls evaluation completed Falls evaluation completed;Education provided;Falls prevention discussed   Functional Status Survey:    Vitals:   11/25/22 1258  BP: 116/60  Pulse: 86  Resp: 16  Temp: 97.6 F (36.4 C)  SpO2: 95%  Weight: 120 lb 3.2 oz (54.5 kg)  Height: 5\' 9"  (1.753 m)   Body mass index is 17.75 kg/m. Physical Exam Vitals reviewed.  Constitutional:      General: She is not in acute distress. HENT:     Head: Normocephalic. Contusion and left periorbital erythema present. No raccoon eyes or Battle's sign.     Comments: Mild bruising to left brow line extending to left cheek    Nose: Nose normal.     Mouth/Throat:     Mouth: Mucous membranes are moist.  Eyes:     Extraocular Movements: Extraocular movements intact.     Right eye: Normal extraocular motion and no nystagmus.     Left eye: Normal extraocular motion and no nystagmus.     Pupils: Pupils are equal, round, and reactive to light.  Cardiovascular:     Rate and Rhythm: Normal rate and regular rhythm.     Pulses: Normal pulses.     Heart sounds: Normal heart sounds.  Pulmonary:     Effort: Pulmonary effort is  normal. No respiratory distress.     Breath sounds: Normal breath sounds. No wheezing.  Abdominal:     General: Bowel sounds are normal.     Palpations: Abdomen is soft.  Musculoskeletal:     Cervical back: Neck supple.     Right lower leg: No edema.     Left lower leg: No edema.     Comments: Able to move all extremities without difficulty  Skin:    General: Skin is warm.     Capillary Refill: Capillary refill takes less than 2 seconds.  Neurological:     General: No focal deficit present.     Mental Status: She is alert and oriented to person, place, and time.     Motor: No weakness.     Gait: Gait normal.  Psychiatric:        Mood and Affect: Mood normal.     Labs reviewed: Recent Labs    04/15/22 1108  NA  142  K 4.1  CL 104  CO2 28  GLUCOSE 90  BUN 14  CREATININE 0.80  CALCIUM 9.8   Recent Labs    04/15/22 1108  AST 21  ALT 14  BILITOT 1.0  PROT 7.0   Recent Labs    04/15/22 1108  WBC 5.0  NEUTROABS 3,065  HGB 13.3  HCT 39.8  MCV 97.8  PLT 246   Lab Results  Component Value Date   TSH 2.28 02/08/2021   No results found for: "HGBA1C" Lab Results  Component Value Date   CHOL 238 (H) 02/08/2021   HDL 102 02/08/2021   LDLCALC 115 (H) 02/08/2021   TRIG 104 02/08/2021   CHOLHDL 2.3 02/08/2021    Significant Diagnostic Results in last 30 days:  CT Head Wo Contrast  Result Date: 11/22/2022 CLINICAL DATA:  Head trauma, minor (Age >= 65y); Polytrauma, blunt. Fall, head injury. EXAM: CT HEAD WITHOUT CONTRAST CT CERVICAL SPINE WITHOUT CONTRAST TECHNIQUE: Multidetector CT imaging of the head and cervical spine was performed following the standard protocol without intravenous contrast. Multiplanar CT image reconstructions of the cervical spine were also generated. RADIATION DOSE REDUCTION: This exam was performed according to the departmental dose-optimization program which includes automated exposure control, adjustment of the mA and/or kV according to  patient size and/or use of iterative reconstruction technique. COMPARISON:  None Available. FINDINGS: CT HEAD FINDINGS Brain: Mild parenchymal volume loss is commensurate with the patient's age. No evidence of acute intracranial hemorrhage or infarct. No abnormal mass effect or midline shift. No abnormal intra or extra-axial mass lesion or fluid collection. Ventricular size is normal. Cerebellum is unremarkable. Vascular: No asymmetric hyperdense vasculature at the skull base. Skull: Normal. Negative for fracture or focal lesion. Sinuses/Orbits: The orbits are unremarkable. Visualized paranasal sinuses are clear. Other: Mastoid air cells and middle ear cavities are clear. Mild left frontal soft tissue swelling. CT CERVICAL SPINE FINDINGS Alignment: 2 mm retrolisthesis C5-6, likely degenerative in nature. Otherwise normal alignment. Skull base and vertebrae: Craniocervical alignment is normal. The atlantodental interval is not widened. No acute fracture of the cervical spine. Vertebral body height is preserved. 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 Soft tissues and spinal canal: No prevertebral fluid or swelling. No visible canal hematoma. Disc levels: There is intervertebral disc space narrowing and endplate remodeling at C5-6 in keeping with changes of advanced degenerative disc disease. Milder degenerative changes are noted at C3-C5 and C6-7. Prevertebral soft tissues are not thickened on sagittal reformats. Posterior disc osteophyte complex at C5-6 results in mild central canal stenosis with mild flattening of the thecal sac. No high-grade canal stenosis. Multilevel uncovertebral and facet arthrosis results in severe bilateral neuroforaminal narrowing at C5-6 and moderate right neuroforaminal narrowing at C3-4 and C6-7. Upper chest: Negative. Other: None IMPRESSION: 1. No acute intracranial abnormality. No calvarial fracture. Mild left frontal soft tissue swelling. 2. No acute fracture or listhesis  of the cervical spine. 3. 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 Electronically Signed   By: Helyn Numbers M.D.   On: 11/22/2022 20:57   CT Cervical Spine Wo Contrast  Result Date: 11/22/2022 CLINICAL DATA:  Head trauma, minor (Age >= 65y); Polytrauma, blunt. Fall, head injury. EXAM: CT HEAD WITHOUT CONTRAST CT CERVICAL SPINE WITHOUT CONTRAST TECHNIQUE: Multidetector CT imaging of the head and cervical spine was performed following the standard protocol without intravenous contrast. Multiplanar CT image reconstructions of the cervical spine were also generated.  RADIATION DOSE REDUCTION: This exam was performed according to the departmental dose-optimization program which includes automated exposure control, adjustment of the mA and/or kV according to patient size and/or use of iterative reconstruction technique. COMPARISON:  None Available. FINDINGS: CT HEAD FINDINGS Brain: Mild parenchymal volume loss is commensurate with the patient's age. No evidence of acute intracranial hemorrhage or infarct. No abnormal mass effect or midline shift. No abnormal intra or extra-axial mass lesion or fluid collection. Ventricular size is normal. Cerebellum is unremarkable. Vascular: No asymmetric hyperdense vasculature at the skull base. Skull: Normal. Negative for fracture or focal lesion. Sinuses/Orbits: The orbits are unremarkable. Visualized paranasal sinuses are clear. Other: Mastoid air cells and middle ear cavities are clear. Mild left frontal soft tissue swelling. CT CERVICAL SPINE FINDINGS Alignment: 2 mm retrolisthesis C5-6, likely degenerative in nature. Otherwise normal alignment. Skull base and vertebrae: Craniocervical alignment is normal. The atlantodental interval is not widened. No acute fracture of the cervical spine. Vertebral body height is preserved. 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 Soft tissues  and spinal canal: No prevertebral fluid or swelling. No visible canal hematoma. Disc levels: There is intervertebral disc space narrowing and endplate remodeling at C5-6 in keeping with changes of advanced degenerative disc disease. Milder degenerative changes are noted at C3-C5 and C6-7. Prevertebral soft tissues are not thickened on sagittal reformats. Posterior disc osteophyte complex at C5-6 results in mild central canal stenosis with mild flattening of the thecal sac. No high-grade canal stenosis. Multilevel uncovertebral and facet arthrosis results in severe bilateral neuroforaminal narrowing at C5-6 and moderate right neuroforaminal narrowing at C3-4 and C6-7. Upper chest: Negative. Other: None IMPRESSION: 1. No acute intracranial abnormality. No calvarial fracture. Mild left frontal soft tissue swelling. 2. No acute fracture or listhesis of the cervical spine. 3. 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 Electronically Signed   By: Helyn Numbers M.D.   On: 11/22/2022 20:57    Assessment/Plan 1. Periorbital contusion of left eye, subsequent encounter - 09/20 fell on kitchen rug and hit head  - CT head/spine no acute intracranial, mild left frontal soft tissue swelling noted - exam unremarkable - left eye swelling from brow line to cheek - cont ice prn for swelling  2. Fall, subsequent encounter - see above - able to move extremities without difficulty - gait normal  3. Injury of head, subsequent encounter - see above - advised to report to ED if unresolved headaches or N/V     Family/ staff Communication: plan discussed with patient and nurse  Labs/tests ordered:  none

## 2022-12-05 ENCOUNTER — Other Ambulatory Visit: Payer: Medicare PPO

## 2022-12-05 DIAGNOSIS — E785 Hyperlipidemia, unspecified: Secondary | ICD-10-CM | POA: Diagnosis not present

## 2022-12-05 DIAGNOSIS — M79672 Pain in left foot: Secondary | ICD-10-CM | POA: Diagnosis not present

## 2022-12-05 DIAGNOSIS — R42 Dizziness and giddiness: Secondary | ICD-10-CM | POA: Diagnosis not present

## 2022-12-05 DIAGNOSIS — M79671 Pain in right foot: Secondary | ICD-10-CM | POA: Diagnosis not present

## 2022-12-06 LAB — CBC WITH DIFFERENTIAL/PLATELET
Absolute Monocytes: 515 {cells}/uL (ref 200–950)
Basophils Absolute: 62 {cells}/uL (ref 0–200)
Basophils Relative: 1.2 %
Eosinophils Absolute: 120 {cells}/uL (ref 15–500)
Eosinophils Relative: 2.3 %
HCT: 40.7 % (ref 35.0–45.0)
Hemoglobin: 12.9 g/dL (ref 11.7–15.5)
Lymphs Abs: 1472 {cells}/uL (ref 850–3900)
MCH: 31.9 pg (ref 27.0–33.0)
MCHC: 31.7 g/dL — ABNORMAL LOW (ref 32.0–36.0)
MCV: 100.7 fL — ABNORMAL HIGH (ref 80.0–100.0)
MPV: 10.7 fL (ref 7.5–12.5)
Monocytes Relative: 9.9 %
Neutro Abs: 3032 {cells}/uL (ref 1500–7800)
Neutrophils Relative %: 58.3 %
Platelets: 251 10*3/uL (ref 140–400)
RBC: 4.04 10*6/uL (ref 3.80–5.10)
RDW: 11.8 % (ref 11.0–15.0)
Total Lymphocyte: 28.3 %
WBC: 5.2 10*3/uL (ref 3.8–10.8)

## 2022-12-06 LAB — COMPLETE METABOLIC PANEL WITH GFR
AG Ratio: 1.6 (calc) (ref 1.0–2.5)
ALT: 28 U/L (ref 6–29)
AST: 26 U/L (ref 10–35)
Albumin: 4 g/dL (ref 3.6–5.1)
Alkaline phosphatase (APISO): 50 U/L (ref 37–153)
BUN: 16 mg/dL (ref 7–25)
CO2: 26 mmol/L (ref 20–32)
Calcium: 9.5 mg/dL (ref 8.6–10.4)
Chloride: 105 mmol/L (ref 98–110)
Creat: 0.85 mg/dL (ref 0.60–0.95)
Globulin: 2.5 g/dL (ref 1.9–3.7)
Glucose, Bld: 91 mg/dL (ref 65–99)
Potassium: 4.3 mmol/L (ref 3.5–5.3)
Sodium: 142 mmol/L (ref 135–146)
Total Bilirubin: 0.5 mg/dL (ref 0.2–1.2)
Total Protein: 6.5 g/dL (ref 6.1–8.1)
eGFR: 68 mL/min/{1.73_m2} (ref >=60)

## 2022-12-06 LAB — LIPID PANEL
Cholesterol: 228 mg/dL — ABNORMAL HIGH (ref <200)
HDL: 85 mg/dL (ref >=50)
LDL Cholesterol (Calc): 128 mg/dL — ABNORMAL HIGH
Non-HDL Cholesterol (Calc): 143 mg/dL — ABNORMAL HIGH (ref <130)
Total CHOL/HDL Ratio: 2.7 (calc) (ref <5.0)
Triglycerides: 56 mg/dL (ref <150)

## 2022-12-06 LAB — TSH: TSH: 1.66 m[IU]/L (ref 0.40–4.50)

## 2022-12-25 ENCOUNTER — Non-Acute Institutional Stay: Payer: Medicare PPO | Admitting: Internal Medicine

## 2022-12-25 ENCOUNTER — Encounter: Payer: Self-pay | Admitting: Internal Medicine

## 2022-12-25 VITALS — BP 128/72 | HR 97 | Temp 97.8°F | Resp 17 | Ht 69.0 in | Wt 116.9 lb

## 2022-12-25 DIAGNOSIS — M79675 Pain in left toe(s): Secondary | ICD-10-CM | POA: Diagnosis not present

## 2022-12-25 DIAGNOSIS — M79674 Pain in right toe(s): Secondary | ICD-10-CM

## 2022-12-25 DIAGNOSIS — E2839 Other primary ovarian failure: Secondary | ICD-10-CM | POA: Diagnosis not present

## 2022-12-25 DIAGNOSIS — E785 Hyperlipidemia, unspecified: Secondary | ICD-10-CM | POA: Diagnosis not present

## 2022-12-25 DIAGNOSIS — Z1231 Encounter for screening mammogram for malignant neoplasm of breast: Secondary | ICD-10-CM | POA: Diagnosis not present

## 2022-12-25 DIAGNOSIS — W19XXXS Unspecified fall, sequela: Secondary | ICD-10-CM | POA: Diagnosis not present

## 2022-12-25 DIAGNOSIS — G3184 Mild cognitive impairment, so stated: Secondary | ICD-10-CM | POA: Diagnosis not present

## 2022-12-25 NOTE — Progress Notes (Addendum)
[facility]:  [facility] Service:  Clinic (12)  Provider:   Code Status: DNR Goals of Care:     12/25/2022    8:10 AM  Advanced Directives  Does Patient Have a Medical Advance Directive? Yes  Type of Estate agent of [place];Living will  Does patient want to make changes to medical advance directive? No - Patient declined  Copy of Healthcare Power of Attorney in Chart? Yes - validated most recent copy scanned in chart (See row information)     Chief Complaint  Patient presents with   Medical Management of Chronic Issues    Patient is here to follow up. Patient also wants to discuss toe pain    HPI: Patient is a 82 y.o. female seen today for medical management of chronic diseases.   [place]ives [place] in [facility]  Daughter and Son are both out of state Daughter was with  her for appointment  Acute issues Mild Cognitive impairment Noticed by her family and [facility] staff that patient is having memory issue especially her short term Memory She Manages with the help of her daughter and Son  They take care of her Bills and Groceries She does not drive anymore  Recurrent[place] It seems patient was having issue with Alcohol abuse and was falling in her apartment One ED visit also few weeks ago But family has taken that away from her Now she is getting beer only and that to Limited  Little Toes in both Feet Hurt That has been her chronic Issue      Past Medical History:  Diagnosis Date   Arthritis     Past Surgical History:  Procedure Laterality Date   TOTAL HIP ARTHROPLASTY 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   Multiple Vitamins-Minerals (MULTIVITAMIN WITH MINERALS) tablet Take 1 tablet by mouth daily.   Polyethyl Glycol-Propyl Glycol (SYSTANE) 0.4-0.3 % SOLN Apply to eye.   polyethylene glycol (MIRALAX / GLYCOLAX) 17 g packet Take 17 g by mouth daily as needed for mild constipation.   No facility-administered encounter medications on file as of 12/25/2022.    Review of Systems:  Review of Systems  Constitutional:  Negative for activity change and appetite change.  HENT: Negative.    Respiratory:  Negative for cough and shortness of breath.   Cardiovascular:  Negative for leg swelling.  Gastrointestinal:  Negative for constipation.  Genitourinary: Negative.   Musculoskeletal:  Positive for gait problem. Negative for arthralgias and myalgias.  Skin: Negative.   Neurological:  Negative for dizziness and weakness.  Psychiatric/Behavioral:  Positive for confusion. Negative for dysphoric mood and sleep disturbance.   All other systems reviewed and are negative.   Health Maintenance  Topic Date Due   Medicare Annual Wellness (AWV)  Never done   Zoster Vaccines- Shingrix (1 of 2) Never done   DEXA SCAN  Never done   DTaP/Tdap/Td (2 - Td or Tdap) 03/04/2017   Pneumonia Vaccine 61+ Years old (2 of 2 - PPSV23 or PCV20) 08/03/2021   INFLUENZA VACCINE  10/03/2022   COVID-19 Vaccine (9 - 2023-24 season) 11/03/2022   HPV VACCINES  Aged Out    Physical Exam: Vitals:   12/25/22 0814  BP: 128/72  Pulse: 97  Resp: 17  Temp: 97.8 F (36.6 C)  TempSrc: Temporal  SpO2: 98%  Weight: 116 lb 14.4 oz (53 kg)  Height: 5\' 9"  (1.753 m)   Body mass index is 17.26 kg/m. Physical Exam Vitals reviewed.  Constitutional:      Appearance: Normal appearance.  HENT:     Head: Normocephalic.     Right Ear: Tympanic membrane normal.     Left Ear: Tympanic membrane normal.     Nose: Nose normal.     Mouth/Throat:      Mouth: Mucous membranes are moist.     Pharynx: Oropharynx is clear.  Eyes:     Pupils: Pupils are equal, round, and reactive to light.  Cardiovascular:     Rate and Rhythm: Normal rate and regular rhythm.     Pulses: Normal pulses.     Heart sounds: Normal heart sounds. No murmur heard. Pulmonary:     Effort: Pulmonary effort is normal.     Breath sounds: Normal breath sounds.  Abdominal:     General: Abdomen is flat. Bowel sounds are normal.     Palpations: Abdomen is soft.  Musculoskeletal:        General: No swelling.     Cervical back: Neck supple.     Comments: Little toe pushes on her other toe No Swelling No redness No sensory loss Good pulses  Skin:    General: Skin is warm.  Neurological:     General: No focal deficit present.     Mental Status: She is alert and oriented to person, place, and time.     Comments: She does walk with a limp  Psychiatric:        Mood and Affect: Mood normal.        Thought Content: Thought content normal.       12/25/2022    8:49 AM 08/15/2021    3:18 PM  MMSE - Mini Mental State Exam  Orientation to time 4 4  Orientation to Place 5 5  Registration 3 3  Attention/ Calculation 3 1  Recall 0 1  Language- name 2 objects 2 2  Language- repeat 1 1  Language- follow 3 step command 3 1  Language- read & follow direction 1 1  Write a sentence 1 1  Copy design 1 1  Total score 24 21    Labs reviewed: Basic Metabolic Panel: Recent Labs    04/15/22 1108 12/05/22 0800  NA 142 142  K 4.1 4.3  CL 104 105  CO2 28 26  GLUCOSE 90 91  BUN 14 16  CREATININE 0.80 0.85  CALCIUM 9.8 9.5  TSH  --  1.66   Liver Function Tests: Recent Labs    04/15/22 1108 12/05/22 0800  AST 21 26  ALT 14 28  BILITOT 1.0 0.5  PROT 7.0 6.5   No results for input(s): "LIPASE", "AMYLASE" in the last 8760 hours. No results for input(s): "AMMONIA" in the last 8760 hours. CBC: Recent Labs    04/15/22 1108 12/05/22 0800  WBC 5.0 5.2  NEUTROABS  3,065 3,032  HGB 13.3 12.9  HCT 39.8 40.7  MCV 97.8 100.7*  PLT 246 251   Lipid Panel: Recent Labs    12/05/22 0800  CHOL 228*  HDL 85  LDLCALC 128*  TRIG 56  CHOLHDL 2.7   No results found for: "HGBA1C"  Procedures since last visit: No results found.  Assessment/Plan 1. Mild cognitive impairment MMSE stable 0/3 in recall Passed her Clock Drawing Patient not interested in further work up or any Meds [place] in [place] with help of her Children  2. Hyperlipidemia, unspecified hyperlipidemia type Good HDL Diet Control Refuses any Meds  3. Fall, sequela ? Alcohol abuse Played a part in her falls Family has taken away her Alcohol Access Will Continue to follow  4. Pain in toes of both feet I did offer her to see Podiatrist and wear Inserts but she is refusing right now  Ordered Mammogram and DEXA in [facility]   Labs/tests ordered:  * No order type specified * Next appt:  Visit date not found

## 2022-12-25 NOTE — Patient Instructions (Addendum)
Patient needs DTAP, PCV 20 , and Shingrix vaccine I have made order for Mammogram and DEXA Scan in [facility] [address] [place], [place], [place] [address] [phone]

## 2023-01-20 ENCOUNTER — Encounter: Payer: Medicare PPO | Admitting: Orthopedic Surgery

## 2023-01-24 ENCOUNTER — Telehealth: Payer: Self-pay | Admitting: Internal Medicine

## 2023-01-24 NOTE — Telephone Encounter (Signed)
Noted, we will forward to her PCP as a [place]

## 2023-01-24 NOTE — Telephone Encounter (Signed)
FYI:  11/22-[facility] has called pt 3x they will not reach back out to the pt, I have spoken to pt's son Onalee Hua and provided him with the scheduling # [email or web address]

## 2023-02-19 IMAGING — CT CT CHEST W/O CM
2 of 3 series · 15 of 36 positions shown, 18 images · non-contrast
Comparison: Neck CT 01/21/2013.

CLINICAL DATA: 79-year-old female status post unwitnessed fall 2
days ago with left chest wall pain and ecchymosis. Right hip pain.

EXAM:
CT CHEST WITHOUT CONTRAST
TECHNIQUE: Multidetector CT imaging of the chest was performed following the
standard protocol without IV contrast.

[Series 2: thorax · axial · 0.73mm/px · z∈[-683,-379]mm · 12 of 180 slices shown, 15 images]
[im 14/180  mediastinal]
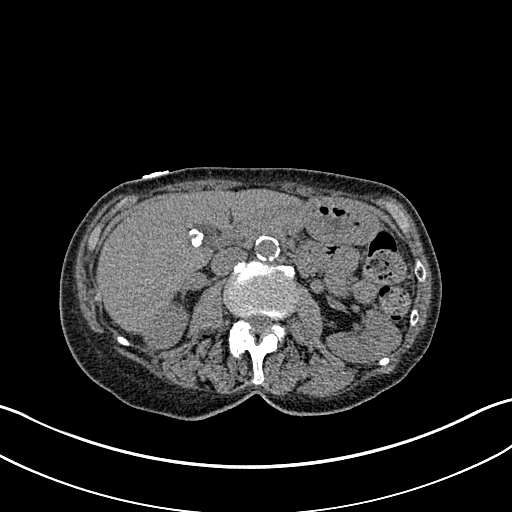
[im 14/180  lung]
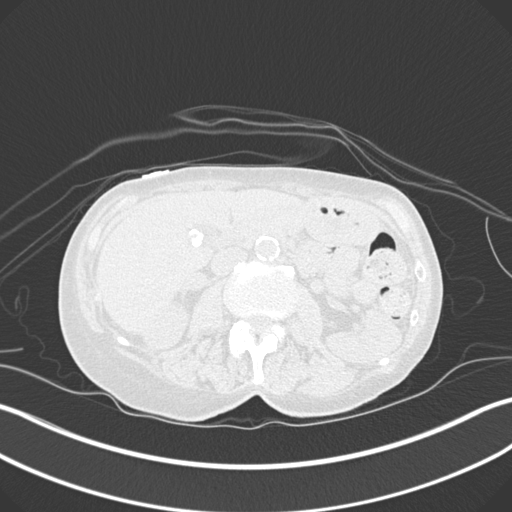
[im 27/180  lung]
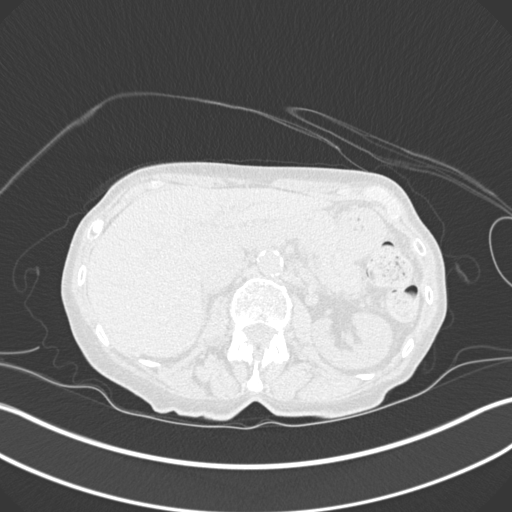
[im 40/180  lung]
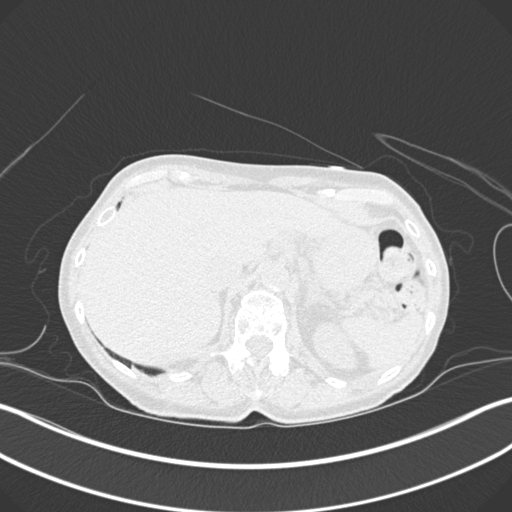
[im 54/180  lung]
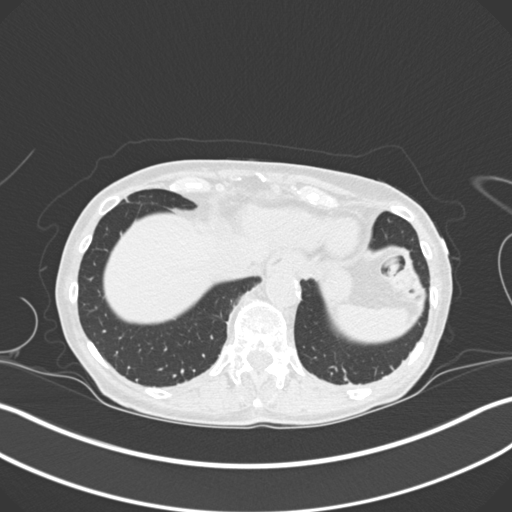
[im 67/180  mediastinal]
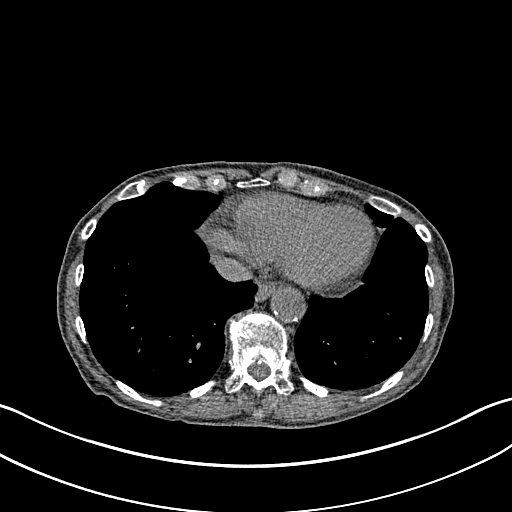
[im 67/180  lung]
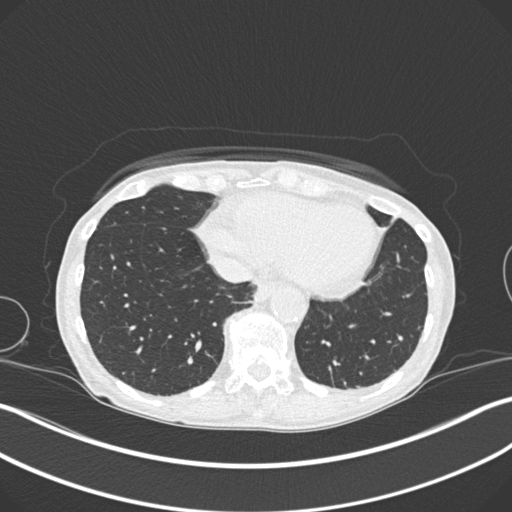
[im 80/180  lung]
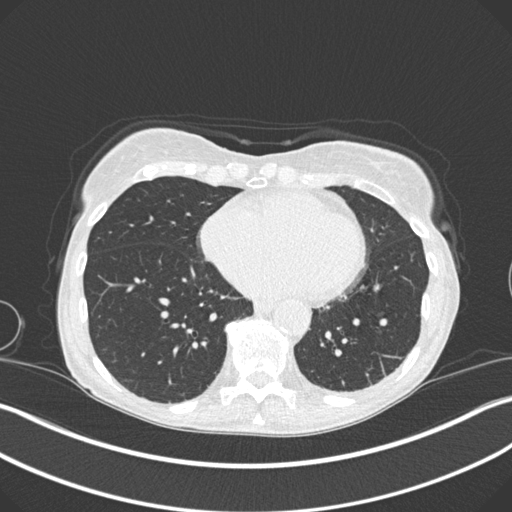
[im 100/180  lung]
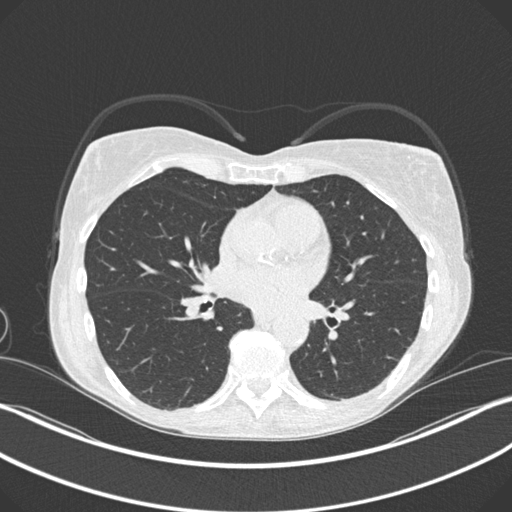
[im 113/180  lung]
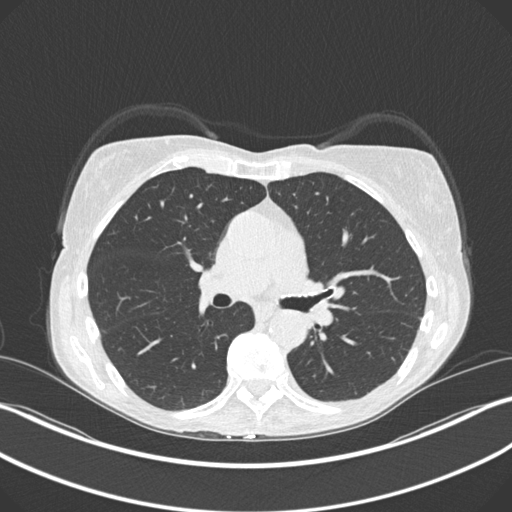
[im 126/180  mediastinal]
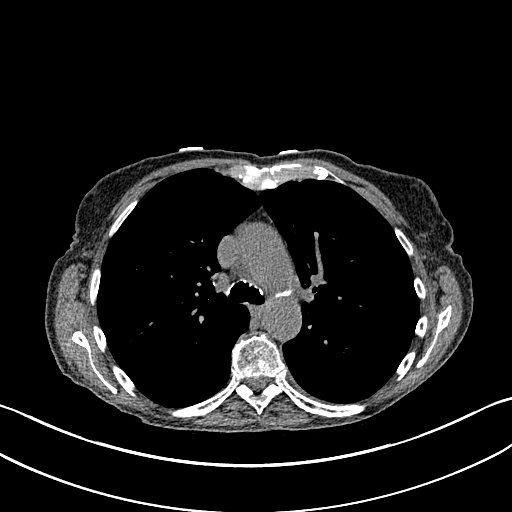
[im 126/180  lung]
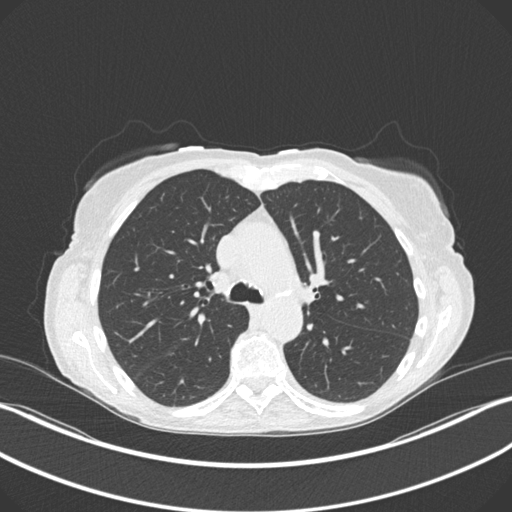
[im 140/180  lung]
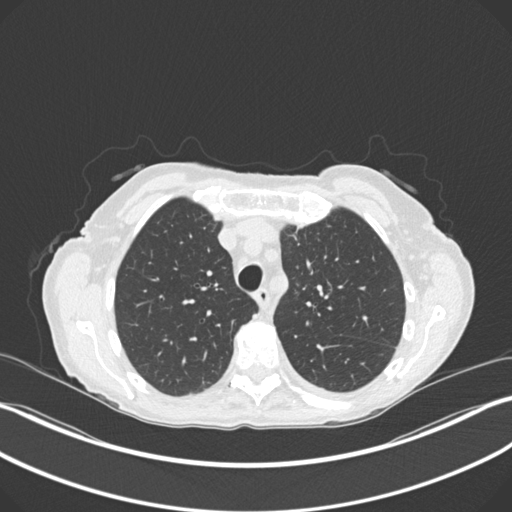
[im 153/180  lung]
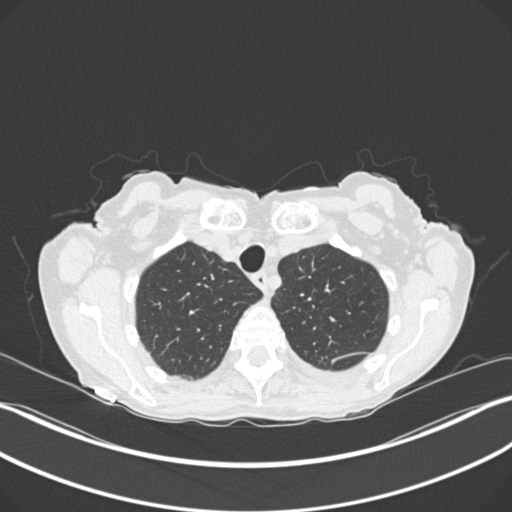
[im 166/180  lung]
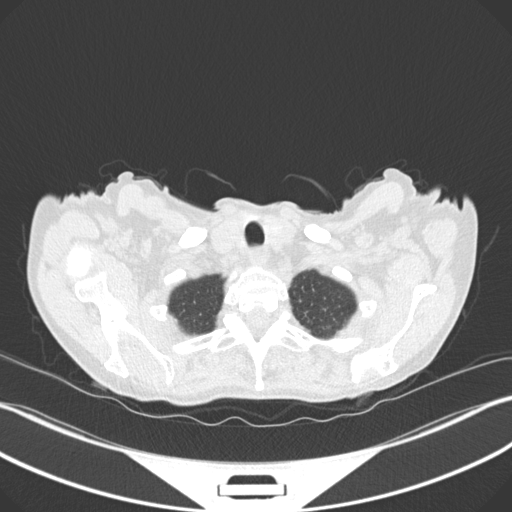

[Series 6: coronal · coronal · 0.70mm/px · 3 of 117 slices shown]
[im 24/117  lung]
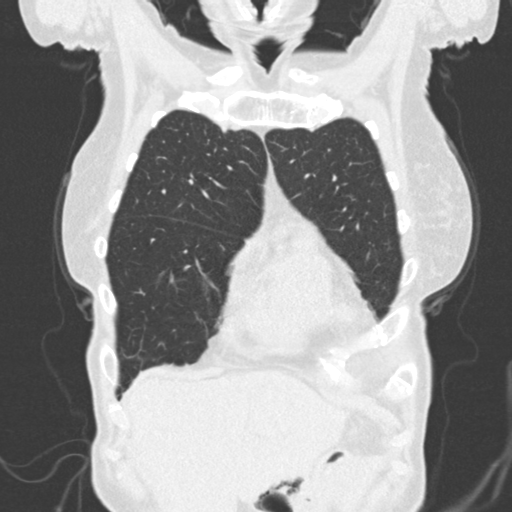
[im 47/117  lung]
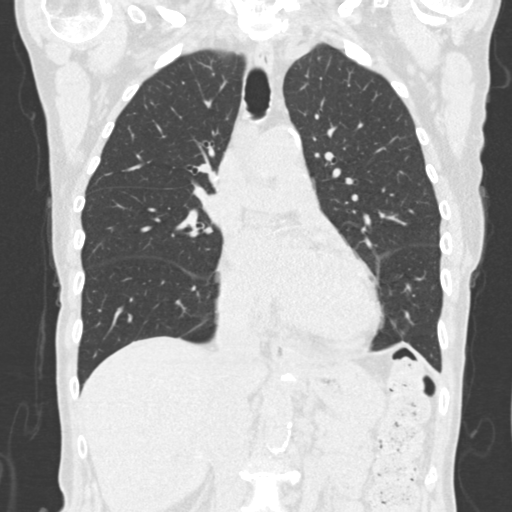
[im 70/117  lung]
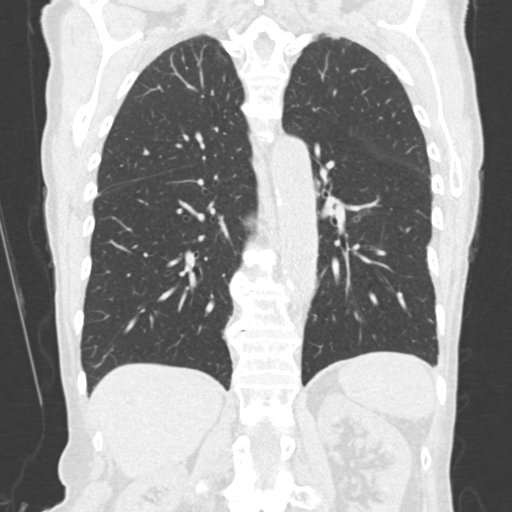

[15 of 36 positions shown; findings below may reference images not displayed]

FINDINGS: Cardiovascular: Calcified aortic atherosclerosis. Calcified coronary
artery atherosclerosis. Upper limits of normal cardiac size. No
pericardial effusion. Vascular patency is not evaluated in the
absence of IV contrast.

Mediastinum/Nodes: No mediastinal hematoma or lymphadenopathy
identified in the absence of IV contrast.

Lungs/Pleura: Major airways are patent. No pneumothorax. No
pulmonary contusion. But there is a trace layering left pleural
effusion.

Minimal atelectasis or scarring also at the left lung base. No
pulmonary nodule.

Upper Abdomen: Negative visible noncontrast liver, spleen, pancreas,
adrenal glands, kidneys and bowel in the upper abdomen.
Cholelithiasis, with 15 mm gallstones.

Musculoskeletal: Mild chronic spondylolisthesis in the upper
thoracic spine is stable and associated with facet arthropathy and
ankylosis. But mild T2, T3, and T5 superior endplate compression
fractures are new since 6642 and age indeterminate (series 7, image
79). Underlying osteopenia.

Nondisplaced fracture of the left T9 transverse process on series 5,
image 86.

Other thoracic vertebrae appear intact.

Visible shoulder osseous structures, sternum, and right side ribs
appear intact.

There are subtle acute fractures of the left 7th, 8th, 9th, and 10th
costovertebral junctions (e.g. Series 5, image 69). No other left
rib fracture is evident.

Visible upper lumbar vertebrae appear intact.
IMPRESSION: 1. Osteopenia with acute fractures of left costovertebral junction 7
through 10, associated nondisplaced left T9 transverse process
fracture.
2. Mild T2, T3, and T5 superior endplate compression fractures are
new since 6642 and age indeterminate but suspicious for acute injury
in this setting.
3. Trace left pleural effusion. No pneumothorax or pulmonary
contusion. No other acute traumatic injury identified. In the
noncontrast chest.
4. Calcified coronary artery and Aortic Atherosclerosis
([id number]). Cholelithiasis.

## 2023-04-16 ENCOUNTER — Encounter: Payer: Self-pay | Admitting: Internal Medicine

## 2023-04-16 ENCOUNTER — Non-Acute Institutional Stay: Payer: Medicare PPO | Admitting: Internal Medicine

## 2023-04-16 VITALS — BP 122/68 | HR 88 | Temp 97.8°F | Resp 17 | Ht 69.0 in | Wt 114.5 lb

## 2023-04-16 DIAGNOSIS — G3184 Mild cognitive impairment, so stated: Secondary | ICD-10-CM | POA: Diagnosis not present

## 2023-04-16 DIAGNOSIS — R42 Dizziness and giddiness: Secondary | ICD-10-CM

## 2023-04-16 NOTE — Progress Notes (Unsigned)
[facility]  Clinic (12)  Provider:   Code Status:  Goals of Care:     04/16/2023    8:56 AM  Advanced Directives  Does Patient Have a Medical Advance Directive? Yes  Type of Estate agent of [place];Living will;Out of facility DNR (pink MOST or yellow form)  Does patient want to make changes to medical advance directive? No - Patient declined  Copy of Healthcare Power of Attorney in Chart? Yes - validated most recent copy scanned in chart (See row information)     Chief Complaint  Patient presents with   Emesis    Patient is being seen seen for nausea and vomiting and has some dizziness   Immunizations    Patient is due for covid, tdap, pnuemonia, flu and covid patient can not remember     HPI: Patient is a 83 y.o. female seen today for an acute visit for Dizziness [place] in [facility]   Daughter and Son are both out of state Has h/o MCI MMSE 24/30 H/o Alcohol abuse  Discussed the use of AI scribe software for clinical note transcription with the patient, who gave verbal consent to proceed.  History of Present Illness   Wendy Blankenship is an 83 year old female who presents with dizziness.  She experiences episodes of dizziness that began this week while her daughter was visiting. The dizziness is described as a sensation of needing to hold onto something to steady herself, without any spinning sensation or feeling like she is going to pass out. There have been no falls during these episodes.  The dizziness occurs suddenly and is not consistently associated with changes in position, such as lying down or standing up. The episodes resolve on their own without the need to lie down. No nausea, coughing, runny nose, or ear problems are associated with the dizziness. She denies recent alcohol consumption and does not use a cane or walker in her apartment, though she takes a cane for long walks as a precaution. She  denies needing to wear her glasses all the time and reports no issues with her vision.  She gets up at least twice a night to use the bathroom and does not consistently feel dizzy during these times. She has not experienced dizziness when getting up from bed or when standing up, but it can occur suddenly at other times.       Past Medical History:  Diagnosis Date   Arthritis     Past Surgical History:  Procedure Laterality Date   TOTAL HIP ARTHROPLASTY Right 10/03/2020   Procedure: TOTAL HIP ARTHROPLASTY ANTERIOR APPROACH;  Surgeon: Durene Romans, MD;  Location: [facility];  Service: Orthopedics;  Laterality: Right;    Allergies  Allergen Reactions   Hydrocodone     Patient got very confused with Hallucinations    Outpatient Encounter Medications as of 04/16/2023  Medication Sig   acetaminophen (TYLENOL) 500 MG tablet Take 1,000 mg by mouth every 8 (eight) hours as needed for mild pain.   Multiple Vitamins-Minerals (MULTIVITAMIN WITH MINERALS) tablet Take 1 tablet by mouth daily.   Polyethyl Glycol-Propyl Glycol (SYSTANE) 0.4-0.3 % SOLN Apply to eye.   polyethylene glycol (MIRALAX / GLYCOLAX) 17 g packet Take 17 g by mouth daily as needed for mild constipation.   No facility-administered encounter medications on file as of 04/16/2023.    Review of Systems:  Review of Systems  Constitutional:  Negative for  activity change and appetite change.  HENT: Negative.    Respiratory:  Negative for cough and shortness of breath.   Cardiovascular:  Negative for leg swelling.  Gastrointestinal:  Negative for constipation.  Genitourinary: Negative.   Musculoskeletal:  Negative for arthralgias, gait problem and myalgias.  Skin: Negative.   Neurological:  Positive for dizziness. Negative for weakness.  Psychiatric/Behavioral:  Positive for confusion. Negative for dysphoric mood and sleep disturbance.     Health Maintenance  Topic Date Due   Medicare Annual Wellness (AWV)  Never done   Zoster  Vaccines- Shingrix (1 of 2) Never done   DEXA SCAN  Never done   DTaP/Tdap/Td (2 - Td or Tdap) 03/04/2017   Pneumonia Vaccine 86+ Years old (2 of 2 - PPSV23 or PCV20) 08/03/2021   INFLUENZA VACCINE  10/03/2022   COVID-19 Vaccine (9 - 2024-25 season) 11/03/2022   HPV VACCINES  Aged Out    Physical Exam: Vitals:   04/16/23 0853  BP: 128/74  Pulse: 76  Resp: 17  Temp: 97.8 F (36.6 C)  TempSrc: Temporal  SpO2: 92%  Weight: 114 lb 8 oz (51.9 kg)  Height: 5\' 9"  (1.753 m)   Body mass index is 16.91 kg/m. Physical Exam Vitals reviewed.  Constitutional:      Appearance: Normal appearance.  HENT:     Head: Normocephalic.     Nose: Nose normal.     Mouth/Throat:     Mouth: Mucous membranes are moist.     Pharynx: Oropharynx is clear.  Eyes:     Pupils: Pupils are equal, round, and reactive to light.  Cardiovascular:     Rate and Rhythm: Normal rate and regular rhythm.     Pulses: Normal pulses.     Heart sounds: Normal heart sounds. No murmur heard. Pulmonary:     Effort: Pulmonary effort is normal.     Breath sounds: Normal breath sounds.  Abdominal:     General: Abdomen is flat. Bowel sounds are normal.     Palpations: Abdomen is soft.  Musculoskeletal:        General: No swelling.     Cervical back: Neck supple.  Skin:    General: Skin is warm.  Neurological:     General: No focal deficit present.     Mental Status: She is alert.     Comments: No Nystagmus Not orthostatic Dix Hall[place]ike test negative  Psychiatric:        Mood and Affect: Mood normal.        Thought Content: Thought content normal.     Labs reviewed: Basic Metabolic Panel: Recent Labs    12/05/22 0800  NA 142  K 4.3  CL 105  CO2 26  GLUCOSE 91  BUN 16  CREATININE 0.85  CALCIUM 9.5  TSH 1.66   Liver Function Tests: Recent Labs    12/05/22 0800  AST 26  ALT 28  BILITOT 0.5  PROT 6.5   No results for input(s): "LIPASE", "AMYLASE" in the last 8760 hours. No results for  input(s): "AMMONIA" in the last 8760 hours. CBC: Recent Labs    12/05/22 0800  WBC 5.2  NEUTROABS 3,032  HGB 12.9  HCT 40.7  MCV 100.7*  PLT 251   Lipid Panel: Recent Labs    12/05/22 0800  CHOL 228*  HDL 85  LDLCALC 128*  TRIG 56  CHOLHDL 2.7   No results found for: "HGBA1C"  Procedures since last visit: No results found.  Assessment/Plan 1. Dizziness (  Primary)Not having symptoms right now ? Etiology Differential includes Vertigo/ Orthostatic Not orthostatic Check Labs tomorrow Therapy Eval  2. Mild cognitive impairment MMSE 24/30 Slightly difficult to get history But managing with help of her Kids She denies restarting alcohol Drinking    Labs/tests ordered:  * No order type specified * Next appt:  06/19/2023

## 2023-04-17 ENCOUNTER — Other Ambulatory Visit: Payer: Medicare PPO

## 2023-04-17 DIAGNOSIS — E538 Deficiency of other specified B group vitamins: Secondary | ICD-10-CM | POA: Diagnosis not present

## 2023-04-17 DIAGNOSIS — G3184 Mild cognitive impairment, so stated: Secondary | ICD-10-CM | POA: Diagnosis not present

## 2023-04-17 DIAGNOSIS — R42 Dizziness and giddiness: Secondary | ICD-10-CM | POA: Diagnosis not present

## 2023-04-18 LAB — COMPLETE METABOLIC PANEL WITH GFR
AG Ratio: 1.5 (calc) (ref 1.0–2.5)
ALT: 16 U/L (ref 6–29)
AST: 18 U/L (ref 10–35)
Albumin: 4 g/dL (ref 3.6–5.1)
Alkaline phosphatase (APISO): 42 U/L (ref 37–153)
BUN: 16 mg/dL (ref 7–25)
CO2: 32 mmol/L (ref 20–32)
Calcium: 9.6 mg/dL (ref 8.6–10.4)
Chloride: 102 mmol/L (ref 98–110)
Creat: 0.76 mg/dL (ref 0.60–0.95)
Globulin: 2.6 g/dL (ref 1.9–3.7)
Glucose, Bld: 85 mg/dL (ref 65–99)
Potassium: 4.2 mmol/L (ref 3.5–5.3)
Sodium: 139 mmol/L (ref 135–146)
Total Bilirubin: 0.7 mg/dL (ref 0.2–1.2)
Total Protein: 6.6 g/dL (ref 6.1–8.1)
eGFR: 77 mL/min/{1.73_m2} (ref >=60)

## 2023-04-18 LAB — CBC WITH DIFFERENTIAL/PLATELET
Absolute Lymphocytes: 1220 {cells}/uL (ref 850–3900)
Absolute Monocytes: 512 {cells}/uL (ref 200–950)
Basophils Absolute: 73 {cells}/uL (ref 0–200)
Basophils Relative: 1.2 %
Eosinophils Absolute: 79 {cells}/uL (ref 15–500)
Eosinophils Relative: 1.3 %
HCT: 39.4 % (ref 35.0–45.0)
Hemoglobin: 12.9 g/dL (ref 11.7–15.5)
MCH: 31.9 pg (ref 27.0–33.0)
MCHC: 32.7 g/dL (ref 32.0–36.0)
MCV: 97.5 fL (ref 80.0–100.0)
MPV: 10.5 fL (ref 7.5–12.5)
Monocytes Relative: 8.4 %
Neutro Abs: 4215 {cells}/uL (ref 1500–7800)
Neutrophils Relative %: 69.1 %
Platelets: 285 10*3/uL (ref 140–400)
RBC: 4.04 10*6/uL (ref 3.80–5.10)
RDW: 11.7 % (ref 11.0–15.0)
Total Lymphocyte: 20 %
WBC: 6.1 10*3/uL (ref 3.8–10.8)

## 2023-04-18 LAB — LIPID PANEL
Cholesterol: 212 mg/dL — ABNORMAL HIGH (ref <200)
HDL: 70 mg/dL (ref >=50)
LDL Cholesterol (Calc): 127 mg/dL — ABNORMAL HIGH
Non-HDL Cholesterol (Calc): 142 mg/dL — ABNORMAL HIGH (ref <130)
Total CHOL/HDL Ratio: 3 (calc) (ref <5.0)
Triglycerides: 59 mg/dL (ref <150)

## 2023-04-18 LAB — VITAMIN B12: Vitamin B-12: 322 pg/mL (ref 200–1100)

## 2023-04-18 LAB — TSH: TSH: 1.13 m[IU]/L (ref 0.40–4.50)

## 2023-04-29 DIAGNOSIS — R262 Difficulty in walking, not elsewhere classified: Secondary | ICD-10-CM | POA: Diagnosis not present

## 2023-05-02 DIAGNOSIS — R262 Difficulty in walking, not elsewhere classified: Secondary | ICD-10-CM | POA: Diagnosis not present

## 2023-05-06 DIAGNOSIS — R262 Difficulty in walking, not elsewhere classified: Secondary | ICD-10-CM | POA: Diagnosis not present

## 2023-05-09 DIAGNOSIS — R262 Difficulty in walking, not elsewhere classified: Secondary | ICD-10-CM | POA: Diagnosis not present

## 2023-05-14 ENCOUNTER — Encounter: Payer: Self-pay | Admitting: Internal Medicine

## 2023-05-14 ENCOUNTER — Non-Acute Institutional Stay: Payer: Medicare PPO | Admitting: Internal Medicine

## 2023-05-14 VITALS — BP 125/88 | HR 90 | Temp 98.3°F | Resp 18 | Ht 69.0 in | Wt 116.7 lb

## 2023-05-14 DIAGNOSIS — R2681 Unsteadiness on feet: Secondary | ICD-10-CM

## 2023-05-14 DIAGNOSIS — E785 Hyperlipidemia, unspecified: Secondary | ICD-10-CM | POA: Diagnosis not present

## 2023-05-14 DIAGNOSIS — R42 Dizziness and giddiness: Secondary | ICD-10-CM | POA: Diagnosis not present

## 2023-05-14 DIAGNOSIS — G3184 Mild cognitive impairment, so stated: Secondary | ICD-10-CM

## 2023-05-14 DIAGNOSIS — R262 Difficulty in walking, not elsewhere classified: Secondary | ICD-10-CM | POA: Diagnosis not present

## 2023-05-14 NOTE — Progress Notes (Signed)
 [facility]:  [facility]:  Clinic (12)  Provider:   Code Status:  Goals of Care:     04/16/2023    8:56 AM  Advanced Directives  Does Patient Have a Medical Advance Directive? Yes  Type of Estate agent of [place];Living will;Out of facility DNR (pink MOST or yellow form)  Does patient want to make changes to medical advance directive? No - Patient declined  Copy of Healthcare Power of Attorney in Chart? Yes - validated most recent copy scanned in chart (See row information)     Chief Complaint  Patient presents with   Medical Management of Chronic Issues    4 week follow up    HPI: Patient is a 83 y.o. female seen today for medical management of chronic diseases.   [place]ives [place] in [facility]  Daughter and Son are both out of state Has h/o MCI MMSE 24/30 H/o Alcohol abuse  Walks with the Limp  Discussed the use of AI scribe software for clinical note transcription with the patient, who gave verbal consent to proceed.  History of Present Illness   The patient, with a history of dizziness, leg pain, and alcohol use, presents for a general check-up.  Dizziness Was seen Last month for this Labs were normal Did not think it was  Vertigo She says her symptoms are resolved now She reports feeling fine and has not had any falls or dizziness recently.  MCI She has a caregiver who assists with transportation and grocery shopping, but she is still able to perform all activities of daily living independently, including showering and laundry. She has been sleeping well and does not have to get up frequently during the night.  Previous Alcohol Abuse . She has been abstaining from alcohol due to a previous incident where she fell and hit her head after consuming more than one drink. She reports feeling lonely since her husband passed away, but she does not wish to take any medication for depression.   She does not believe her memory is bad  enough to warrant medication at this time.  She has a caregiver who comes every day and she plans to start exercising with her, including swimming and using exercise machines. She has a cane, which she uses when walking outside or in crowded places.   She has not had any issues with her skin, but she has noticed some brown spots, which she attributes to aging.   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   Multiple Vitamins-Minerals (MULTIVITAMIN WITH MINERALS) tablet Take 1 tablet by mouth daily.   Polyethyl Glycol-Propyl Glycol (SYSTANE) 0.4-0.3 % SOLN Apply to eye.   polyethylene glycol (MIRALAX / GLYCOLAX) 17 g packet Take 17 g by mouth daily as needed for mild constipation.   No facility-administered encounter medications on file as of  05/14/2023.    Review of Systems:  Review of Systems  Constitutional:  Negative for activity change and appetite change.  HENT: Negative.    Respiratory:  Negative for cough and shortness of breath.   Cardiovascular:  Negative for leg swelling.  Gastrointestinal:  Negative for constipation.  Genitourinary: Negative.   Musculoskeletal:  Positive for gait problem. Negative for arthralgias and myalgias.  Skin: Negative.   Neurological:  Negative for dizziness and weakness.  Psychiatric/Behavioral:  Positive for confusion. Negative for dysphoric mood and sleep disturbance.      Health Maintenance  Topic Date Due   Medicare Annual Wellness (AWV)  Never done   Zoster Vaccines- Shingrix (1 of 2) Never done   DEXA SCAN  Never done   DTaP/Tdap/Td (2 - Td or Tdap) 03/04/2017   Pneumonia Vaccine 18+ Years old (2 of 2 - PPSV23 or PCV20) 08/03/2021   INFLUENZA VACCINE  10/03/2022   COVID-19 Vaccine (9 - 2024-25 season) 11/03/2022   HPV VACCINES  Aged Out    Physical Exam: Vitals:   05/14/23 0936  BP: 125/88  Pulse: 90  Resp: 18  Temp: 98.3 F (36.8 C)  SpO2: 97%  Weight: 116 lb 11.2 oz (52.9 kg)  Height: 5\' 9"  (1.753 m)   Body mass index is 17.23 kg/m. Physical Exam Vitals reviewed.  Constitutional:      Appearance: Normal appearance.  HENT:     Head: Normocephalic.     Right Ear: Tympanic membrane normal.     Left Ear: Tympanic membrane normal.     Nose: Nose normal.     Mouth/Throat:     Mouth: Mucous membranes are moist.     Pharynx: Oropharynx is clear.  Eyes:     Pupils: Pupils are equal, round, and reactive to light.  Cardiovascular:     Rate and Rhythm: Normal rate and regular rhythm.     Pulses: Normal pulses.     Heart sounds: Normal heart sounds. No murmur heard. Pulmonary:     Effort: Pulmonary effort is normal.     Breath sounds: Normal breath sounds.  Abdominal:     General: Abdomen is flat. Bowel sounds are normal.     Palpations: Abdomen is soft.  Musculoskeletal:        General: No swelling.     Cervical back: Neck supple.  Skin:    General: Skin is warm.  Neurological:     General: No focal deficit present.     Mental Status: She is alert.     Comments: Walks with the Limp  Psychiatric:        Mood and Affect: Mood normal.        Thought Content: Thought content normal.     Labs reviewed: Basic Metabolic Panel: Recent Labs    12/05/22 0800 04/17/23 0810  NA 142 139  K 4.3 4.2  CL 105 102  CO2 26 32  GLUCOSE 91 85  BUN 16 16  CREATININE 0.85 0.76  CALCIUM 9.5 9.6  TSH 1.66 1.13   Liver Function  Tests: Recent Labs    12/05/22 0800 04/17/23 0810  AST 26 18  ALT 28 16  BILITOT 0.5 0.7  PROT 6.5 6.6   No results for input(s): "LIPASE", "AMYLASE" in the last 8760 hours. No results for input(s): "AMMONIA" in the last 8760 hours. CBC: Recent Labs    12/05/22 0800 04/17/23 0810  WBC 5.2 6.1  NEUTROABS 3,032 4,215  HGB 12.9 12.9  HCT 40.7 39.4  MCV 100.7* 97.5  PLT 251 285   Lipid Panel: Recent Labs    12/05/22 0800 04/17/23 0810  CHOL 228* 212*  HDL 85 70  LDLCALC 128* 127*  TRIG 56 59  CHOLHDL 2.7 3.0   No results found for: "HGBA1C"  Procedures since last visit: No results found.  Assessment/Plan Assessment and Plan    Alcohol Use Disorder She has been abstinent and recognizes the risk of drinking alone. Committed to abstinence for safety.  Fall Risk . Reports no recent falls and feels steady. - Encourage use of a cane when walking outside or in crowded places.  Memory Concerns/MCI Last MMSE 24/30  0/3 in recall Passed her Clock Drawing Patient not interested in further work up or any Meds [place] help of her Children/ Caregiver Concerns about memory but does not feel it warrants medication. Open to assessment during a wellness visit. - Schedule an annual wellness visit to assess memory. Pain in toes of both feet  Wears Insert   General Health Maintenance In good health with no significant issues. Encouraged to maintain regular health screenings and vaccinations. Has a helper for daily activities and plans to participate in exercise classes and swimming. - Schedule a mammogram. - Ensure vaccinations are up to date, including pneumonia and tetanus vaccines. - Encourage participation in exercise classes and swimming for physical activity.  Follow-up Scheduled for follow-up in six months and annual wellness visit to check vaccinations and memory. - Follow up in six months. - Schedule an annual wellness visit.         Labs/tests  ordered:  * No order type specified * Next appt:  06/25/2023

## 2023-05-14 NOTE — Patient Instructions (Signed)
 Take Cane when you go outside You need PCV 20 vaccine and TDAP

## 2023-05-15 DIAGNOSIS — R262 Difficulty in walking, not elsewhere classified: Secondary | ICD-10-CM | POA: Diagnosis not present

## 2023-05-20 DIAGNOSIS — R262 Difficulty in walking, not elsewhere classified: Secondary | ICD-10-CM | POA: Diagnosis not present

## 2023-05-22 DIAGNOSIS — R262 Difficulty in walking, not elsewhere classified: Secondary | ICD-10-CM | POA: Diagnosis not present

## 2023-05-26 ENCOUNTER — Ambulatory Visit: Admitting: Orthopedic Surgery

## 2023-05-27 DIAGNOSIS — R262 Difficulty in walking, not elsewhere classified: Secondary | ICD-10-CM | POA: Diagnosis not present

## 2023-05-29 DIAGNOSIS — R262 Difficulty in walking, not elsewhere classified: Secondary | ICD-10-CM | POA: Diagnosis not present

## 2023-06-03 DIAGNOSIS — R262 Difficulty in walking, not elsewhere classified: Secondary | ICD-10-CM | POA: Diagnosis not present

## 2023-06-05 DIAGNOSIS — R262 Difficulty in walking, not elsewhere classified: Secondary | ICD-10-CM | POA: Diagnosis not present

## 2023-06-10 DIAGNOSIS — R262 Difficulty in walking, not elsewhere classified: Secondary | ICD-10-CM | POA: Diagnosis not present

## 2023-06-12 DIAGNOSIS — R262 Difficulty in walking, not elsewhere classified: Secondary | ICD-10-CM | POA: Diagnosis not present

## 2023-06-17 DIAGNOSIS — L814 Other melanin hyperpigmentation: Secondary | ICD-10-CM | POA: Diagnosis not present

## 2023-06-17 DIAGNOSIS — L821 Other seborrheic keratosis: Secondary | ICD-10-CM | POA: Diagnosis not present

## 2023-06-17 DIAGNOSIS — L570 Actinic keratosis: Secondary | ICD-10-CM | POA: Diagnosis not present

## 2023-06-17 DIAGNOSIS — R262 Difficulty in walking, not elsewhere classified: Secondary | ICD-10-CM | POA: Diagnosis not present

## 2023-06-19 ENCOUNTER — Other Ambulatory Visit: Payer: Medicare PPO

## 2023-06-19 DIAGNOSIS — R262 Difficulty in walking, not elsewhere classified: Secondary | ICD-10-CM | POA: Diagnosis not present

## 2023-06-24 DIAGNOSIS — R262 Difficulty in walking, not elsewhere classified: Secondary | ICD-10-CM | POA: Diagnosis not present

## 2023-06-25 ENCOUNTER — Encounter: Payer: Medicare PPO | Admitting: Internal Medicine

## 2023-06-26 DIAGNOSIS — R262 Difficulty in walking, not elsewhere classified: Secondary | ICD-10-CM | POA: Diagnosis not present

## 2023-07-01 DIAGNOSIS — R262 Difficulty in walking, not elsewhere classified: Secondary | ICD-10-CM | POA: Diagnosis not present

## 2023-07-03 DIAGNOSIS — R262 Difficulty in walking, not elsewhere classified: Secondary | ICD-10-CM | POA: Diagnosis not present

## 2023-07-08 DIAGNOSIS — R262 Difficulty in walking, not elsewhere classified: Secondary | ICD-10-CM | POA: Diagnosis not present

## 2023-07-10 DIAGNOSIS — R262 Difficulty in walking, not elsewhere classified: Secondary | ICD-10-CM | POA: Diagnosis not present

## 2023-07-15 DIAGNOSIS — R262 Difficulty in walking, not elsewhere classified: Secondary | ICD-10-CM | POA: Diagnosis not present

## 2023-07-17 DIAGNOSIS — R262 Difficulty in walking, not elsewhere classified: Secondary | ICD-10-CM | POA: Diagnosis not present

## 2023-07-24 DIAGNOSIS — R262 Difficulty in walking, not elsewhere classified: Secondary | ICD-10-CM | POA: Diagnosis not present

## 2023-07-25 DIAGNOSIS — R262 Difficulty in walking, not elsewhere classified: Secondary | ICD-10-CM | POA: Diagnosis not present

## 2023-07-30 DIAGNOSIS — R262 Difficulty in walking, not elsewhere classified: Secondary | ICD-10-CM | POA: Diagnosis not present

## 2023-07-31 DIAGNOSIS — R262 Difficulty in walking, not elsewhere classified: Secondary | ICD-10-CM | POA: Diagnosis not present

## 2023-08-05 DIAGNOSIS — R262 Difficulty in walking, not elsewhere classified: Secondary | ICD-10-CM | POA: Diagnosis not present

## 2023-08-07 DIAGNOSIS — R262 Difficulty in walking, not elsewhere classified: Secondary | ICD-10-CM | POA: Diagnosis not present

## 2023-08-12 DIAGNOSIS — R262 Difficulty in walking, not elsewhere classified: Secondary | ICD-10-CM | POA: Diagnosis not present

## 2023-08-14 DIAGNOSIS — R262 Difficulty in walking, not elsewhere classified: Secondary | ICD-10-CM | POA: Diagnosis not present

## 2023-08-19 DIAGNOSIS — R262 Difficulty in walking, not elsewhere classified: Secondary | ICD-10-CM | POA: Diagnosis not present

## 2023-08-21 DIAGNOSIS — R262 Difficulty in walking, not elsewhere classified: Secondary | ICD-10-CM | POA: Diagnosis not present

## 2023-08-28 DIAGNOSIS — R262 Difficulty in walking, not elsewhere classified: Secondary | ICD-10-CM | POA: Diagnosis not present

## 2023-09-02 DIAGNOSIS — R262 Difficulty in walking, not elsewhere classified: Secondary | ICD-10-CM | POA: Diagnosis not present

## 2023-09-04 DIAGNOSIS — R262 Difficulty in walking, not elsewhere classified: Secondary | ICD-10-CM | POA: Diagnosis not present

## 2023-09-09 DIAGNOSIS — R262 Difficulty in walking, not elsewhere classified: Secondary | ICD-10-CM | POA: Diagnosis not present

## 2023-09-11 DIAGNOSIS — R262 Difficulty in walking, not elsewhere classified: Secondary | ICD-10-CM | POA: Diagnosis not present

## 2023-09-16 DIAGNOSIS — R262 Difficulty in walking, not elsewhere classified: Secondary | ICD-10-CM | POA: Diagnosis not present

## 2023-09-18 DIAGNOSIS — R262 Difficulty in walking, not elsewhere classified: Secondary | ICD-10-CM | POA: Diagnosis not present

## 2023-11-10 ENCOUNTER — Other Ambulatory Visit

## 2023-11-12 ENCOUNTER — Non-Acute Institutional Stay: Admitting: Internal Medicine

## 2023-11-12 VITALS — Ht 69.0 in

## 2023-11-12 DIAGNOSIS — Z23 Encounter for immunization: Secondary | ICD-10-CM

## 2023-11-15 NOTE — Progress Notes (Signed)
Patient was no Show

## 2023-11-19 ENCOUNTER — Non-Acute Institutional Stay: Admitting: Internal Medicine

## 2023-11-19 ENCOUNTER — Encounter: Payer: Self-pay | Admitting: Internal Medicine

## 2023-11-19 VITALS — BP 116/78 | HR 81 | Temp 96.9°F | Resp 18 | Ht 69.0 in | Wt 118.4 lb

## 2023-11-19 DIAGNOSIS — G3184 Mild cognitive impairment, so stated: Secondary | ICD-10-CM

## 2023-11-19 DIAGNOSIS — E785 Hyperlipidemia, unspecified: Secondary | ICD-10-CM | POA: Diagnosis not present

## 2023-11-19 DIAGNOSIS — R42 Dizziness and giddiness: Secondary | ICD-10-CM

## 2023-11-19 DIAGNOSIS — R2681 Unsteadiness on feet: Secondary | ICD-10-CM | POA: Diagnosis not present

## 2023-11-19 NOTE — Patient Instructions (Addendum)
 1) Our records indicate you are due for an annual wellness visit, please call the office at [facility] at [phone] to schedule (video or in-person).     2) Labs will be done on September 25 at [facility] at 7:45 am

## 2023-11-19 NOTE — Progress Notes (Signed)
 [facility]  [facility]  Cl[facility] (12)  Provider:   Code Status:  Goals of Care:     05/14/2023    2:40 PM  Advanced Directives  Does Patient Have a Medical Advance Directive? Yes  Type of Estate agent of [place];Living will;Out of facility DNR (pink MOST or yellow form)  Does patient want to make changes to medical advance directive? No - Patient declined  Copy of Healthcare Power of Attorney in Chart? Yes - validated most recent copy scanned in chart (See row information)     Chief Complaint  Patient presents with   Medical Management of Chronic Issues    6 month follow-up, Spoke with patient to go over tetnaus, p    HPI: Patient is a 83 y.o. female seen today for medical management of chronic diseases.   Patient has h/o   MCI Last MMSE 24/30 Refused Meds are Further Work up [place] of moving [place] as soon as Room available Daughter and Son are both out of state H/o Alcohol abuse No Recent Use  Walks with the Limp   Discussed the use of AI scribe software for clinical note transcription with the patient, who gave verbal consent to proceed.  History of Present Illness   Wendy Blankenship is an 83 year old female who presents for consideration of moving to an assisted living facility.  She is on the waiting list for a room in an assisted living facility where her friend resides. She has no current health concerns and maintains a positive outlook. She does not take any regular medications but acknowledges the need for vitamins. Her diet is healthy, with occasional cooking, and includes yogurt, milk, and orange juice while avoiding snacks. She sleeps well and her weight is stable. . She manages her daily activities independently without the use of hearing aids.  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   Multiple Vitamins-Minerals (MULTIVITAMIN WITH MINERALS) tablet Take 1 tablet by mouth daily. (Patient taking differently: Take 1 tablet by mouth daily as needed.)   Polyethyl Glycol-Propyl Glycol (SYSTANE) 0.4-0.3 % SOLN Apply to eye. (Patient taking differently: Apply to eye daily as needed.)   polyethylene glycol (MIRALAX  / GLYCOLAX ) 17 g packet Take 17 g by mouth daily as needed for mild constipation.   No facility-administered encounter medications on file as of 11/19/2023.    Review of Systems:  Review of Systems  Constitutional:  Negative for activity change and appetite change.  HENT: Negative.    Respiratory:  Negative for cough and shortness of breath.   Cardiovascular:  Negative for leg swelling.  Gastrointestinal:  Negative for constipation.  Genitourinary: Negative.   Musculoskeletal:  Negative for arthralgias, gait problem and myalgias.  Skin: Negative.   Neurological:  Negative for dizziness and weakness.  Psychiatric/Behavioral:  Positive for confusion. Negative for dysphoric mood and  sleep disturbance.     Health Maintenance  Topic Date Due   Medicare Annual Wellness (AWV)  Never done   Zoster Vaccines- Shingrix (1 of 2) Never done   DEXA SCAN  Never done   DTaP/Tdap/Td (2 - Td or Tdap) 03/04/2017   Pneumococcal Vaccine: 50+ Years (2 of 2 - PCV20 or PCV21) 08/03/2021   Influenza Vaccine  10/03/2023   COVID-19 Vaccine (9 - 2025-26 season) 11/03/2023   HPV VACCINES  Aged Out   Meningococcal B Vaccine  Aged Out    Physical Exam: Vitals:    11/19/23 0842  BP: 116/78  Pulse: 81  Resp: 18  Temp: (!) 96.9 F (36.1 C)  SpO2: 98%  Weight: 118 lb 6.4 oz (53.7 kg)  Height: 5' 9 (1.753 m)   Body mass index is 17.48 kg/m. Physical Exam Vitals reviewed.  Constitutional:      Appearance: Normal appearance.  HENT:     Head: Normocephalic.     Right Ear: Tympanic membrane normal.     Left Ear: Tympanic membrane normal.     Nose: Nose normal.     Mouth/Throat:     Mouth: Mucous membranes are moist.     Pharynx: Oropharynx is clear.  Eyes:     Pupils: Pupils are equal, round, and reactive to light.  Cardiovascular:     Rate and Rhythm: Normal rate and regular rhythm.     Pulses: Normal pulses.     Heart sounds: Normal heart sounds. No murmur heard. Pulmonary:     Effort: Pulmonary effort is normal.     Breath sounds: Normal breath sounds.  Abdominal:     General: Abdomen is flat. Bowel sounds are normal.     Palpations: Abdomen is soft.  Musculoskeletal:        General: No swelling.     Cervical back: Neck supple.  Skin:    General: Skin is warm.  Neurological:     General: No focal deficit present.     Mental Status: She is alert and oriented to person, place, and time.  Psychiatric:        Mood and Affect: Mood normal.        Thought Content: Thought content normal.     Labs reviewed: Basic Metabolic Panel: Recent Labs    12/05/22 0800 04/17/23 0810  NA 142 139  K 4.3 4.2  CL 105 102  CO2 26 32  GLUCOSE 91 85  BUN 16 16  CREATININE 0.85 0.76  CALCIUM 9.5 9.6  TSH 1.66 1.13   Liver Function Tests: Recent Labs    12/05/22 0800 04/17/23 0810  AST 26 18  ALT 28 16  BILITOT 0.5 0.7  PROT 6.5 6.6   No results for input(s): LIPASE, AMYLASE in the last 8760 hours. No results for input(s): AMMONIA in the last 8760 hours. CBC: Recent Labs    12/05/22 0800 04/17/23 0810  WBC 5.2 6.1  NEUTROABS 3,032 4,215  HGB 12.9 12.9  HCT 40.7 39.4  MCV 100.7* 97.5  PLT 251 285   Lipid  Panel: Recent Labs    12/05/22 0800 04/17/23 0810  CHOL 228* 212*  HDL 85 70  LDLCALC 128* 127*  TRIG 56 59  CHOLHDL 2.7 3.0   No results found for: HGBA1C  Procedures since last visit: No results found.  Assessment/Plan 1. Mild cognitive impairment (Primary) No Meds  Last MMSE 24/30  0/3 in recall Passed her Clock Drawing  Plan to Move [place]  2. Hyperlipidemia, unspecified hyperlipidemia type  3. Unstable gait No Falls Encourage use of a cane when walking outside or in crowded places.   Once patient moves [place] can do her Vaccinations and Labs  Labs/tests ordered:   Next appt:  Visit date not found

## 2023-11-27 DIAGNOSIS — G3184 Mild cognitive impairment, so stated: Secondary | ICD-10-CM | POA: Diagnosis not present

## 2023-11-27 DIAGNOSIS — R2681 Unsteadiness on feet: Secondary | ICD-10-CM | POA: Diagnosis not present

## 2023-11-27 DIAGNOSIS — E785 Hyperlipidemia, unspecified: Secondary | ICD-10-CM | POA: Diagnosis not present

## 2023-11-27 LAB — COMPLETE METABOLIC PANEL WITHOUT GFR
AG Ratio: 1.5 (calc) (ref 1.0–2.5)
ALT: 12 U/L (ref 6–29)
AST: 16 U/L (ref 10–35)
Albumin: 4 g/dL (ref 3.6–5.1)
Alkaline phosphatase (APISO): 46 U/L (ref 37–153)
BUN: 19 mg/dL (ref 7–25)
CO2: 29 mmol/L (ref 20–32)
Calcium: 9.4 mg/dL (ref 8.6–10.4)
Chloride: 104 mmol/L (ref 98–110)
Creat: 0.79 mg/dL (ref 0.60–0.95)
Globulin: 2.6 g/dL (ref 1.9–3.7)
Glucose, Bld: 91 mg/dL (ref 65–139)
Potassium: 4.1 mmol/L (ref 3.5–5.3)
Sodium: 140 mmol/L (ref 135–146)
Total Bilirubin: 0.4 mg/dL (ref 0.2–1.2)
Total Protein: 6.6 g/dL (ref 6.1–8.1)

## 2023-11-27 LAB — CBC WITH DIFFERENTIAL/PLATELET
Absolute Lymphocytes: 1519 {cells}/uL (ref 850–3900)
Absolute Monocytes: 616 {cells}/uL (ref 200–950)
Basophils Absolute: 70 {cells}/uL (ref 0–200)
Basophils Relative: 1 %
Eosinophils Absolute: 133 {cells}/uL (ref 15–500)
Eosinophils Relative: 1.9 %
HCT: 39.3 % (ref 35.0–45.0)
Hemoglobin: 13.1 g/dL (ref 11.7–15.5)
MCH: 32.2 pg (ref 27.0–33.0)
MCHC: 33.3 g/dL (ref 32.0–36.0)
MCV: 96.6 fL (ref 80.0–100.0)
MPV: 10.5 fL (ref 7.5–12.5)
Monocytes Relative: 8.8 %
Neutro Abs: 4662 {cells}/uL (ref 1500–7800)
Neutrophils Relative %: 66.6 %
Platelets: 253 Thousand/uL (ref 140–400)
RBC: 4.07 Million/uL (ref 3.80–5.10)
RDW: 11.8 % (ref 11.0–15.0)
Total Lymphocyte: 21.7 %
WBC: 7 Thousand/uL (ref 3.8–10.8)

## 2023-12-03 ENCOUNTER — Ambulatory Visit: Payer: Self-pay | Admitting: Internal Medicine

## 2024-01-20 DIAGNOSIS — D492 Neoplasm of unspecified behavior of bone, soft tissue, and skin: Secondary | ICD-10-CM | POA: Diagnosis not present

## 2024-01-20 DIAGNOSIS — L821 Other seborrheic keratosis: Secondary | ICD-10-CM | POA: Diagnosis not present

## 2024-01-20 DIAGNOSIS — C44329 Squamous cell carcinoma of skin of other parts of face: Secondary | ICD-10-CM | POA: Diagnosis not present

## 2024-01-20 DIAGNOSIS — L814 Other melanin hyperpigmentation: Secondary | ICD-10-CM | POA: Diagnosis not present

## 2024-04-14 ENCOUNTER — Encounter: Admitting: Internal Medicine

## 2024-04-21 ENCOUNTER — Encounter: Admitting: Internal Medicine
